# Patient Record
Sex: Male | Born: 1980 | Race: White | Hispanic: No | Marital: Married | Smoking: Former smoker
Health system: Southern US, Community
[De-identification: ages and names within clinical notes are randomized; demographics above are authoritative.]

## PROBLEM LIST (undated history)

## (undated) DIAGNOSIS — T8859XA Other complications of anesthesia, initial encounter: Secondary | ICD-10-CM

## (undated) DIAGNOSIS — R310 Gross hematuria: Secondary | ICD-10-CM

## (undated) DIAGNOSIS — N201 Calculus of ureter: Secondary | ICD-10-CM

## (undated) DIAGNOSIS — R3915 Urgency of urination: Secondary | ICD-10-CM

## (undated) DIAGNOSIS — Z87442 Personal history of urinary calculi: Secondary | ICD-10-CM

## (undated) DIAGNOSIS — N200 Calculus of kidney: Secondary | ICD-10-CM

## (undated) DIAGNOSIS — T4145XA Adverse effect of unspecified anesthetic, initial encounter: Secondary | ICD-10-CM

---

## 2001-07-11 ENCOUNTER — Encounter: Payer: Self-pay | Admitting: Emergency Medicine

## 2001-07-11 ENCOUNTER — Emergency Department (HOSPITAL_COMMUNITY): Admission: EM | Admit: 2001-07-11 | Discharge: 2001-07-11 | Payer: Self-pay | Admitting: Emergency Medicine

## 2002-08-07 ENCOUNTER — Encounter: Payer: Self-pay | Admitting: Emergency Medicine

## 2002-08-07 ENCOUNTER — Emergency Department (HOSPITAL_COMMUNITY): Admission: EM | Admit: 2002-08-07 | Discharge: 2002-08-07 | Payer: Self-pay | Admitting: Emergency Medicine

## 2003-02-26 ENCOUNTER — Emergency Department (HOSPITAL_COMMUNITY): Admission: EM | Admit: 2003-02-26 | Discharge: 2003-02-26 | Payer: Self-pay

## 2003-02-26 ENCOUNTER — Encounter: Payer: Self-pay | Admitting: Emergency Medicine

## 2003-07-09 ENCOUNTER — Emergency Department (HOSPITAL_COMMUNITY): Admission: AD | Admit: 2003-07-09 | Discharge: 2003-07-09 | Payer: Self-pay | Admitting: Family Medicine

## 2003-07-27 ENCOUNTER — Emergency Department (HOSPITAL_COMMUNITY): Admission: EM | Admit: 2003-07-27 | Discharge: 2003-07-28 | Payer: Self-pay | Admitting: Emergency Medicine

## 2003-10-19 ENCOUNTER — Emergency Department (HOSPITAL_COMMUNITY): Admission: EM | Admit: 2003-10-19 | Discharge: 2003-10-19 | Payer: Self-pay | Admitting: Family Medicine

## 2003-10-31 ENCOUNTER — Emergency Department (HOSPITAL_COMMUNITY): Admission: EM | Admit: 2003-10-31 | Discharge: 2003-10-31 | Payer: Self-pay

## 2003-12-05 ENCOUNTER — Encounter (INDEPENDENT_AMBULATORY_CARE_PROVIDER_SITE_OTHER): Payer: Self-pay | Admitting: *Deleted

## 2003-12-05 ENCOUNTER — Ambulatory Visit (HOSPITAL_COMMUNITY): Admission: RE | Admit: 2003-12-05 | Discharge: 2003-12-05 | Payer: Self-pay | Admitting: Gastroenterology

## 2004-05-05 HISTORY — PX: LUMBAR FUSION: SHX111

## 2004-07-17 ENCOUNTER — Ambulatory Visit: Payer: Self-pay

## 2004-10-28 ENCOUNTER — Emergency Department (HOSPITAL_COMMUNITY): Admission: EM | Admit: 2004-10-28 | Discharge: 2004-10-28 | Payer: Self-pay | Admitting: Emergency Medicine

## 2005-06-28 ENCOUNTER — Emergency Department (HOSPITAL_COMMUNITY): Admission: EM | Admit: 2005-06-28 | Discharge: 2005-06-28 | Payer: Self-pay | Admitting: Family Medicine

## 2006-02-05 ENCOUNTER — Emergency Department (HOSPITAL_COMMUNITY): Admission: EM | Admit: 2006-02-05 | Discharge: 2006-02-05 | Payer: Self-pay | Admitting: Emergency Medicine

## 2006-02-09 ENCOUNTER — Emergency Department (HOSPITAL_COMMUNITY): Admission: EM | Admit: 2006-02-09 | Discharge: 2006-02-09 | Payer: Self-pay | Admitting: Emergency Medicine

## 2006-02-11 ENCOUNTER — Emergency Department (HOSPITAL_COMMUNITY): Admission: EM | Admit: 2006-02-11 | Discharge: 2006-02-11 | Payer: Self-pay | Admitting: Emergency Medicine

## 2006-02-16 ENCOUNTER — Ambulatory Visit (HOSPITAL_BASED_OUTPATIENT_CLINIC_OR_DEPARTMENT_OTHER): Admission: RE | Admit: 2006-02-16 | Discharge: 2006-02-16 | Payer: Self-pay | Admitting: Urology

## 2006-02-16 HISTORY — PX: OTHER SURGICAL HISTORY: SHX169

## 2006-10-30 ENCOUNTER — Emergency Department (HOSPITAL_COMMUNITY): Admission: EM | Admit: 2006-10-30 | Discharge: 2006-10-30 | Payer: Self-pay | Admitting: Emergency Medicine

## 2007-02-13 ENCOUNTER — Emergency Department (HOSPITAL_COMMUNITY): Admission: EM | Admit: 2007-02-13 | Discharge: 2007-02-13 | Payer: Self-pay | Admitting: Emergency Medicine

## 2007-04-12 ENCOUNTER — Emergency Department (HOSPITAL_COMMUNITY): Admission: EM | Admit: 2007-04-12 | Discharge: 2007-04-12 | Payer: Self-pay | Admitting: Family Medicine

## 2007-06-07 ENCOUNTER — Emergency Department (HOSPITAL_COMMUNITY): Admission: EM | Admit: 2007-06-07 | Discharge: 2007-06-07 | Payer: Self-pay | Admitting: Emergency Medicine

## 2007-12-01 ENCOUNTER — Emergency Department (HOSPITAL_COMMUNITY): Admission: EM | Admit: 2007-12-01 | Discharge: 2007-12-01 | Payer: Self-pay | Admitting: Emergency Medicine

## 2008-03-22 ENCOUNTER — Emergency Department (HOSPITAL_COMMUNITY): Admission: EM | Admit: 2008-03-22 | Discharge: 2008-03-22 | Payer: Self-pay | Admitting: Emergency Medicine

## 2008-03-23 ENCOUNTER — Ambulatory Visit: Payer: Self-pay | Admitting: Family Medicine

## 2008-03-23 DIAGNOSIS — M545 Low back pain: Secondary | ICD-10-CM

## 2008-03-23 DIAGNOSIS — F17200 Nicotine dependence, unspecified, uncomplicated: Secondary | ICD-10-CM | POA: Insufficient documentation

## 2008-03-28 ENCOUNTER — Telehealth: Payer: Self-pay | Admitting: Family Medicine

## 2008-03-28 ENCOUNTER — Ambulatory Visit: Payer: Self-pay | Admitting: Family Medicine

## 2008-03-31 ENCOUNTER — Telehealth (INDEPENDENT_AMBULATORY_CARE_PROVIDER_SITE_OTHER): Payer: Self-pay | Admitting: Family Medicine

## 2008-08-03 ENCOUNTER — Emergency Department (HOSPITAL_COMMUNITY): Admission: EM | Admit: 2008-08-03 | Discharge: 2008-08-03 | Payer: Self-pay | Admitting: Emergency Medicine

## 2008-08-29 ENCOUNTER — Ambulatory Visit (HOSPITAL_COMMUNITY): Admission: RE | Admit: 2008-08-29 | Discharge: 2008-08-29 | Payer: Self-pay | Admitting: Family Medicine

## 2008-08-29 ENCOUNTER — Emergency Department (HOSPITAL_COMMUNITY): Admission: EM | Admit: 2008-08-29 | Discharge: 2008-08-29 | Payer: Self-pay | Admitting: Emergency Medicine

## 2008-08-29 ENCOUNTER — Ambulatory Visit: Payer: Self-pay | Admitting: Family Medicine

## 2008-08-29 DIAGNOSIS — R079 Chest pain, unspecified: Secondary | ICD-10-CM

## 2008-09-02 ENCOUNTER — Emergency Department (HOSPITAL_COMMUNITY): Admission: EM | Admit: 2008-09-02 | Discharge: 2008-09-02 | Payer: Self-pay | Admitting: Emergency Medicine

## 2008-09-04 ENCOUNTER — Encounter: Payer: Self-pay | Admitting: Family Medicine

## 2008-09-05 ENCOUNTER — Ambulatory Visit: Payer: Self-pay | Admitting: Family Medicine

## 2008-09-05 DIAGNOSIS — N200 Calculus of kidney: Secondary | ICD-10-CM | POA: Insufficient documentation

## 2008-09-06 ENCOUNTER — Encounter: Payer: Self-pay | Admitting: Family Medicine

## 2008-09-18 ENCOUNTER — Telehealth (INDEPENDENT_AMBULATORY_CARE_PROVIDER_SITE_OTHER): Payer: Self-pay | Admitting: *Deleted

## 2008-09-25 ENCOUNTER — Telehealth: Payer: Self-pay | Admitting: Family Medicine

## 2009-12-10 ENCOUNTER — Emergency Department: Payer: Self-pay | Admitting: Emergency Medicine

## 2010-06-24 ENCOUNTER — Encounter: Payer: Self-pay | Admitting: *Deleted

## 2010-08-13 LAB — URINALYSIS, ROUTINE W REFLEX MICROSCOPIC
Bilirubin Urine: NEGATIVE
Ketones, ur: NEGATIVE mg/dL
Leukocytes, UA: NEGATIVE
Protein, ur: 100 mg/dL — AB
Specific Gravity, Urine: 1.019 (ref 1.005–1.030)
pH: 6.5 (ref 5.0–8.0)

## 2010-08-13 LAB — POCT I-STAT, CHEM 8
Creatinine, Ser: 1.3 mg/dL (ref 0.4–1.5)
HCT: 45 % (ref 39.0–52.0)
Hemoglobin: 15.3 g/dL (ref 13.0–17.0)
Sodium: 143 mEq/L (ref 135–145)
TCO2: 26 mmol/L (ref 0–100)

## 2010-08-13 LAB — URINE MICROSCOPIC-ADD ON

## 2010-08-14 LAB — POCT I-STAT, CHEM 8
BUN: 7 mg/dL (ref 6–23)
Calcium, Ion: 1.12 mmol/L (ref 1.12–1.32)
Creatinine, Ser: 1 mg/dL (ref 0.4–1.5)
HCT: 44 % (ref 39.0–52.0)
Hemoglobin: 15 g/dL (ref 13.0–17.0)
Sodium: 141 mEq/L (ref 135–145)
TCO2: 24 mmol/L (ref 0–100)

## 2010-08-14 LAB — D-DIMER, QUANTITATIVE: D-Dimer, Quant: <0.22 ug/mL-FEU (ref 0.00–0.48)

## 2010-09-20 NOTE — Op Note (Signed)
NAMETRUXTON, STUPKA               ACCOUNT NO.:  [id number]   MEDICAL RECORD NO.:  [id number]          PATIENT TYPE:  AMB   LOCATION:  [facility]                         FACILITY:  [facility]   PHYSICIAN:  Maretta Bees. Vonita Moss, M.D.DATE OF BIRTH:  [birth year]   DATE OF PROCEDURE:  02/16/2006  DATE OF DISCHARGE:                                 OPERATIVE REPORT   PREOPERATIVE DIAGNOSIS:  Distal right ureteral calculus,   POSTOPERATIVE DIAGNOSIS:  Distal right ureteral calculus, plus mild urethral  meatal stenosis.   PROCEDURE:  Cystoscopy and urethral meatal dilation, right ureteroscopy and  stone basketing.   SURGEON:  Dr. Larey Dresser   ANESTHESIA:  General.   INDICATIONS:  This 30 year old gentleman has been recurrent symptomatic and  missing work from the distal right ureteral stone confirmed to be still  present on a CT scan this morning.  He and his wife opted for removal today.   PROCEDURE:  The patient brought to the operating room, placed lithotomy  position.  External genitalia were prepped and draped in usual fashion.  He  was cystoscoped but only after I had dilated a mild urethral meatal  stenosis.  Anterior and prostatic urethra were normal.  The bladder was  unremarkable.  I tried to pass the sensor wire up the right ureter but it  would not go very easily so I inserted a Glidewire which did go up easily  past the stone.  With the Glidewire in position in the right ureter,  I used  the inner core of the ureteral access dilating sheath and dilated the  intramural ureter.  I then utilized 6-French rigid ureteroscope and  visualized the stone about 2 cm up the ureter and used the nitinol stone  basket to retrieve it intact and then I gave the stone to the patient's  wife.  There was no significant ureteral trauma, so I removed the Glidewire  and did not leave in a double-J catheter.  I emptied the bladder and he was  sent to recovery room in good condition.  He was given 30 mg of  Toradol for  postop pain relief.      Maretta Bees. Vonita Moss, M.D.  Electronically Signed     LJP/MEDQ  D:  02/16/2006  T:  02/17/2006  Job:  [id number]

## 2010-09-20 NOTE — Op Note (Signed)
[name], Kent Goodwin                           ACCOUNT NO.:  [id number]   MEDICAL RECORD NO.:  [id number]                   PATIENT TYPE:  AMB   LOCATION:  ENDO                                 FACILITY:  [facility]   PHYSICIAN:  John C. Madilyn Fireman, M.D.                 DATE OF BIRTH:  06/11/1980   DATE OF PROCEDURE:  12/05/2003  DATE OF DISCHARGE:                                 OPERATIVE REPORT   PROCEDURE:  Esophagogastroduodenoscopy with biopsy.   INDICATIONS FOR PROCEDURE:  Abdominal pain, nausea, vomiting, diarrhea and  weight loss.   DESCRIPTION OF PROCEDURE:  The patient was placed in the left lateral  decubitus position then placed on the pulse monitor with continuous low flow  oxygen delivered by nasal cannula. He was sedated with 50 mcg IV fentanyl  and 6 mg IV Versed. The Olympus video endoscope was advanced under direct  vision into the oropharynx and esophagus. The esophagus was straight and of  normal caliber with the squamocolumnar line at 38 cm. There was no visible  hiatal hernia, ring, stricture or other abnormality of the GE junction. The  stomach was entered and a small amount of liquid secretions were suctioned  from the fundus. Retroflexed view of the cardia was unremarkable. The fundus  and body appeared normal. The antrum showed some increased erythema and  granularity consistent with mild antral gastritis, no focal erosions, ulcers  or exudate.  A CLOtest was obtained.  The pylorus was nondeformed and easily  allowed passage of the endoscope tip into the duodenum. Both the bulb and  second portion were well inspected and appear to be within normal limits.  The Papilla or Vater was seen and was also normal. Small bowel biopsies were  taken to rule out celiac disease. The scope was then withdrawn and the  patient prepared for colonoscopy.  He tolerated the procedure well and there  were no immediate complications.   IMPRESSION:  Mild antral gastritis otherwise normal  study.   PLAN:  Will await biopsy and CLOtest and will proceed with colonoscopy.                                               John C. Madilyn Fireman, M.D.    JCH/MEDQ  D:  12/05/2003  T:  12/05/2003  Job:  [id number]

## 2010-09-20 NOTE — Op Note (Signed)
Kent Goodwin, Kent Goodwin                           ACCOUNT NO.:  [id number]   MEDICAL RECORD NO.:  [id number]                   PATIENT TYPE:  AMB   LOCATION:  ENDO                                 [facility]ACILITY:  [facility]   PHYSICIAN:  John C. Madilyn Fireman, M.D.                 DATE OF BIRTH:  1981-01-12   DATE OF PROCEDURE:  12/05/2003  DATE OF DISCHARGE:                                 OPERATIVE REPORT   PROCEDURE:  Colonoscopy.   INDICATIONS FOR PROCEDURE:  Abdominal pain, nausea, vomiting, diarrhea and  weight loss.   DESCRIPTION OF PROCEDURE:  The patient was placed in the left lateral  decubitus position then placed on the pulse monitor with continuous low flow  oxygen delivered by nasal cannula. He was sedated with 50 mcg IV fentanyl  and 4 mg IV Versed in addition to the medicine given for the previous EGD.  The Olympus video colonoscope was inserted into the rectum and advanced to  the cecum, confirmed by transillumination at McBurney's point and  visualization of the ileocecal valve and appendiceal orifice. The prep was  excellent. The terminal ileum was intubated and explored for several  centimeters and appeared to be within normal limits.  The cecum, ascending,  transverse, descending, sigmoid and rectum all appeared normal with no  masses, polyps, diverticula or other mucosal abnormalities. Biopsies were  taken of the rectum and sigmoid to rule out microscopic colitis. The scope  was then withdrawn and the patient returned to the recovery room in stable  condition. He tolerated the procedure well and there were no immediate  complications.   IMPRESSION:  Normal colonoscopy including the terminal ileum.   PLAN:  Will await all biopsies.                                               John C. Madilyn Fireman, M.D.    JCH/MEDQ  D:  12/05/2003  T:  12/05/2003  Job:  [id number]

## 2011-01-09 ENCOUNTER — Emergency Department (HOSPITAL_COMMUNITY)
Admission: EM | Admit: 2011-01-09 | Discharge: 2011-01-10 | Disposition: A | Discharge status: Home / Self Care | Payer: 59 | Attending: Emergency Medicine | Admitting: Emergency Medicine

## 2011-01-09 DIAGNOSIS — N201 Calculus of ureter: Secondary | ICD-10-CM | POA: Insufficient documentation

## 2011-01-09 DIAGNOSIS — R109 Unspecified abdominal pain: Secondary | ICD-10-CM | POA: Insufficient documentation

## 2011-01-09 DIAGNOSIS — Z87442 Personal history of urinary calculi: Secondary | ICD-10-CM | POA: Insufficient documentation

## 2011-01-09 DIAGNOSIS — R319 Hematuria, unspecified: Secondary | ICD-10-CM | POA: Insufficient documentation

## 2011-01-10 ENCOUNTER — Encounter (HOSPITAL_COMMUNITY): Payer: Self-pay

## 2011-01-10 ENCOUNTER — Emergency Department (HOSPITAL_COMMUNITY): Payer: 59

## 2011-01-12 ENCOUNTER — Emergency Department (HOSPITAL_COMMUNITY)
Admission: EM | Admit: 2011-01-12 | Discharge: 2011-01-12 | Disposition: A | Discharge status: Home / Self Care | Payer: 59 | Attending: Emergency Medicine | Admitting: Emergency Medicine

## 2011-01-12 DIAGNOSIS — R109 Unspecified abdominal pain: Secondary | ICD-10-CM | POA: Insufficient documentation

## 2011-01-12 DIAGNOSIS — N200 Calculus of kidney: Secondary | ICD-10-CM | POA: Insufficient documentation

## 2011-01-12 LAB — URINALYSIS, ROUTINE W REFLEX MICROSCOPIC
Bilirubin Urine: NEGATIVE
Glucose, UA: NEGATIVE mg/dL
pH: 6 (ref 5.0–8.0)

## 2011-01-12 LAB — DIFFERENTIAL
Basophils Relative: 0 % (ref 0–1)
Eosinophils Absolute: 0.2 10*3/uL (ref 0.0–0.7)
Monocytes Relative: 10 % (ref 3–12)
Neutro Abs: 8.6 10*3/uL — ABNORMAL HIGH (ref 1.7–7.7)

## 2011-01-12 LAB — CBC
Hemoglobin: 15.4 g/dL (ref 13.0–17.0)
MCHC: 34.9 g/dL (ref 30.0–36.0)
Platelets: 224 10*3/uL (ref 150–400)
RBC: 5.11 MIL/uL (ref 4.22–5.81)

## 2011-01-12 LAB — BASIC METABOLIC PANEL
BUN: 12 mg/dL (ref 6–23)
CO2: 28 mEq/L (ref 19–32)
Chloride: 102 mEq/L (ref 96–112)
Creatinine, Ser: 1.91 mg/dL — ABNORMAL HIGH (ref 0.50–1.35)
Glucose, Bld: 108 mg/dL — ABNORMAL HIGH (ref 70–99)
Sodium: 138 mEq/L (ref 135–145)

## 2011-01-13 ENCOUNTER — Inpatient Hospital Stay (HOSPITAL_COMMUNITY)
Admission: AD | Admit: 2011-01-13 | Discharge: 2011-01-14 | DRG: 694 | Disposition: A | Discharge status: Home / Self Care | Payer: 59 | Source: Ambulatory Visit | Attending: Urology | Admitting: Urology

## 2011-01-13 DIAGNOSIS — N1330 Unspecified hydronephrosis: Secondary | ICD-10-CM | POA: Diagnosis present

## 2011-01-13 DIAGNOSIS — N200 Calculus of kidney: Secondary | ICD-10-CM | POA: Diagnosis present

## 2011-01-13 DIAGNOSIS — N201 Calculus of ureter: Principal | ICD-10-CM | POA: Diagnosis present

## 2011-01-13 DIAGNOSIS — G8929 Other chronic pain: Secondary | ICD-10-CM | POA: Diagnosis present

## 2011-01-13 DIAGNOSIS — M549 Dorsalgia, unspecified: Secondary | ICD-10-CM | POA: Diagnosis present

## 2011-01-13 DIAGNOSIS — R112 Nausea with vomiting, unspecified: Secondary | ICD-10-CM | POA: Diagnosis present

## 2011-01-14 HISTORY — PX: OTHER SURGICAL HISTORY: SHX169

## 2011-01-14 LAB — URINE MICROSCOPIC-ADD ON

## 2011-01-14 LAB — CBC
HCT: 39.4 % (ref 39.0–52.0)
Hemoglobin: 13.2 g/dL (ref 13.0–17.0)
MCH: 29.5 pg (ref 26.0–34.0)
MCHC: 33.5 g/dL (ref 30.0–36.0)
MCV: 87.9 fL (ref 78.0–100.0)
Platelets: 202 10*3/uL (ref 150–400)
RBC: 4.48 MIL/uL (ref 4.22–5.81)
RDW: 12.5 % (ref 11.5–15.5)
WBC: 8 10*3/uL (ref 4.0–10.5)

## 2011-01-14 LAB — URINALYSIS, ROUTINE W REFLEX MICROSCOPIC
Bilirubin Urine: NEGATIVE
Glucose, UA: NEGATIVE mg/dL
Nitrite: NEGATIVE
Specific Gravity, Urine: 1.012 (ref 1.005–1.030)
Urobilinogen, UA: 1 mg/dL (ref 0.0–1.0)
pH: 6.5 (ref 5.0–8.0)

## 2011-01-14 LAB — BASIC METABOLIC PANEL
BUN: 14 mg/dL (ref 6–23)
Chloride: 105 mEq/L (ref 96–112)
GFR calc Af Amer: 47 mL/min — ABNORMAL LOW (ref >60)
GFR calc non Af Amer: 39 mL/min — ABNORMAL LOW (ref >60)
Glucose, Bld: 101 mg/dL — ABNORMAL HIGH (ref 70–99)
Potassium: 4.9 mEq/L (ref 3.5–5.1)
Sodium: 141 mEq/L (ref 135–145)

## 2011-01-14 LAB — URINE CULTURE

## 2011-01-21 ENCOUNTER — Ambulatory Visit (HOSPITAL_BASED_OUTPATIENT_CLINIC_OR_DEPARTMENT_OTHER)
Admission: RE | Admit: 2011-01-21 | Discharge: 2011-01-21 | Disposition: A | Discharge status: Home / Self Care | Payer: 59 | Source: Ambulatory Visit | Attending: Urology | Admitting: Urology

## 2011-01-21 DIAGNOSIS — Z79899 Other long term (current) drug therapy: Secondary | ICD-10-CM | POA: Insufficient documentation

## 2011-01-21 DIAGNOSIS — F17200 Nicotine dependence, unspecified, uncomplicated: Secondary | ICD-10-CM | POA: Insufficient documentation

## 2011-01-21 DIAGNOSIS — N200 Calculus of kidney: Secondary | ICD-10-CM | POA: Insufficient documentation

## 2011-01-21 DIAGNOSIS — N201 Calculus of ureter: Secondary | ICD-10-CM | POA: Insufficient documentation

## 2011-01-21 LAB — BASIC METABOLIC PANEL
BUN: 17 mg/dL (ref 6–23)
CO2: 26 mEq/L (ref 19–32)
GFR calc non Af Amer: >60 mL/min (ref >60)
Glucose, Bld: 101 mg/dL — ABNORMAL HIGH (ref 70–99)
Potassium: 4.1 mEq/L (ref 3.5–5.1)
Sodium: 135 mEq/L (ref 135–145)

## 2011-01-21 LAB — CBC
HCT: 44.9 % (ref 39.0–52.0)
Hemoglobin: 15.4 g/dL (ref 13.0–17.0)
MCH: 29.8 pg (ref 26.0–34.0)
MCHC: 34.3 g/dL (ref 30.0–36.0)
RBC: 5.17 MIL/uL (ref 4.22–5.81)

## 2011-01-24 LAB — URINALYSIS, ROUTINE W REFLEX MICROSCOPIC
Glucose, UA: NEGATIVE
Specific Gravity, Urine: 1.03
pH: 6

## 2011-01-24 LAB — POCT I-STAT CREATININE
Creatinine, Ser: 1.1
Operator id: 151321

## 2011-01-24 LAB — I-STAT 8, (EC8 V) (CONVERTED LAB)
Acid-base deficit: 1
Bicarbonate: 21.9
Chloride: 109
HCT: 47
Operator id: 151321
TCO2: 23
pCO2, Ven: 29.9 — ABNORMAL LOW
pH, Ven: 7.473 — ABNORMAL HIGH

## 2011-01-24 LAB — URINE MICROSCOPIC-ADD ON

## 2011-01-27 NOTE — Op Note (Addendum)
NAMESHERRI, LEVENHAGEN               ACCOUNT NO.:  [id number]  MEDICAL RECORD NO.:  [id number]  LOCATION:  1421                         FACILITY:  [facility]  PHYSICIAN:  Jerilee Field, MD   DATE OF BIRTH:  1980-08-24  DATE OF PROCEDURE:  01/21/2011 DATE OF DISCHARGE:  01/21/2011                              OPERATIVE REPORT   PREOPERATIVE DIAGNOSES: 1. Left ureteral stone. 2. Left renal stone.  POSTOPERATIVE DIAGNOSES: 1. Left ureteral stone. 2. Left renal stone.  PROCEDURES:  Cystoscopy with left ureteroscopy, laser lithotripsy, stone basket extraction, and stent placement.  SURGEON:  Jerilee Field, MD  TYPE OF ANESTHESIA:  General.  INDICATIONS FOR PROCEDURE:  Mr. Keadle is 30 years old.  He had a 4-mm left proximal stone with hydro in the left proximal ureter.  He underwent cystoscopy with stent placement, but I was not able to get retrograde access with the ureteroscope at that time.  He followed up in the office with his stent, having typical stent colic.  We repeated the KUB which showed the stone in the left proximal ureter beside the stent and several other calcifications in the upper, middle, and lower pole, which were also visible on the CT.  We discussed the nature, risks, benefits, and alternatives to ureteroscopy; a laser lithotripsy with stone extraction versus extracorporeal shock wave lithotripsy versus no treatment.  All questions were answered and the patient elected to proceed with ureteroscopic extraction.  We discussed that with a lot of stone burden.  He may require multiple procedures and all of the fragments may fail to pass.  We also discussed risks of bleeding, infection, and ureteral injury among others.  All questions were answered and again he elected to proceed with ureteroscopy.  FINDINGS:  On fluoroscopy, the stone was still visible on the proximal ureter, but was pushed into the kidney gaining retrograde access.  There were also upper,  middle, and lower pole calcifications.  All calcifications were sought out on fluoro and ureteroscopically, and fragmented into small 1 to 2-mm pieces.  At the end of fragmentation, no stones were visible on fluoroscopy.  Several fragments were removed for analysis, but these fragments were all 1 or 2 mm and very difficult to get into a basket.  DESCRIPTION OF PROCEDURE:  After consent was obtained, the patient brought to the operating room.  A time-out was performed explaining the patient and procedure.  He was given preop antibiotics and SCDs were in place.  After adequate anesthesia, he was placed in lithotomy and the external genitalia prepped and draped in the usual fashion.  A rigid cystoscope was passed per urethra where the left stent was visualized, a scout fluoro image was obtained as previously dictated.  The stent was grasped and pulled through the urethral meatus and a Sensor wire was advanced through the stent and coiled in the upper pole.  Semirigid ureteroscope was used to carefully inspect the distal ureter to ensure there were no stones here, and indeed the distal ureter was stone and injury free.  The semirigid scope was removed and the ureteral access sheath was placed without difficulty.  The flexible ureteroscope was then advanced up into the kidney.  Prior to removing the semirigid scope, a second wire was advanced and coiled in the kidney.  When the second wire went up, stone in the proximal ureter moved up into the kidney.  The ureteral access sheath was placed over the second wire and again the second wire was removed and the flexible ureteroscope was advanced up into the kidney.  Because the stone washed into the kidney, fluoroscopy was used to locate all the stones.  There was a collection of 2 stones in the lower, middle, and upper poles.  There was a larger stone in the lower pole and a larger one in the upper pole.  The entire kidney was first inspected,  then a 265 micron laser fiber was advanced to the setting of 0.5 and 12.  All stones were fragmented to small 1 to 2-mm pieces.  At the end of fragmentation, fluoroscopy revealed no visible stones.  I attempted to grasp as many pieces as I could from these fragments, but they were very small and were difficult even in getting the nitinol basket.  Again, all stone areas were visualized ureteroscopically and noted to have excellent fragmentation as well as on fluoroscopy, no stones were visualized.  Therefore, visualization was becoming difficult in the kidney due to some light hematuria.  The ureteral access sheath was backed out onto the ureteroscope and the renal pelvis, UPJ, entire ureter were carefully inspected and noted to be stone and injury free.  Many small fragments were left in the kidney, but again were not visible on fluoro.  The remaining wire was back loaded on the cystoscope and a 6 x 26 stent was placed.  The wire was removed.  A good coil was seen in the kidney and a good coil in the bladder.  The patient was then awakened, taken to recovery room in stable condition.  A string was left on the stent.  SPECIMENS:  Stone fragments to Pathology.  ESTIMATED BLOOD LOSS:  Less than 5 mL.  COMPLICATIONS:  None.  DISPOSITION:  The patient stable to PACU.          ______________________________ Jerilee Field, MD     ME/MEDQ  D:  01/21/2011  T:  01/21/2011  Job:  [id number]  Electronically Signed by Jerilee Field MD on 02/19/2011 12:59:35 PM

## 2011-01-27 NOTE — Op Note (Signed)
Kent Goodwin, Kent Goodwin               ACCOUNT NO.:  [id number]  MEDICAL RECORD NO.:  [id number]  LOCATION:  1421                         FACILITY:  [facility]  PHYSICIAN:  Jerilee Field, MD   DATE OF BIRTH:  12/05/1980  DATE OF PROCEDURE: DATE OF DISCHARGE:  01/14/2011                              OPERATIVE REPORT   PREOPERATIVE DIAGNOSES: 1. Left ureteral stone. 2. Left renal stone.  POSTOPERATIVE DIAGNOSES: 1. Left ureteral stone. 2. Left renal stone.  PROCEDURE:  Cystoscopy with left retrograde pyelogram and left ureteral stent placement.  SURGEON:  Jerilee Field, MD.  TYPE OF ANESTHESIA:  General.  INDICATIONS FOR PROCEDURE:  Kent Goodwin is a 30 year old white male who has a known history of stone formation.  He had a sudden onset of left flank pain approximately 4 days ago.  He was seen in the emergency room twice and then our office and subsequently continued to have uncontrolled pain, nausea, and vomiting.  We discussed the nature of cystoscopy with left ureteral stent placement, possible ureteroscopy, and laser lithotripsy with stone removal.  We discussed the nature, risks, benefits, and alternatives to this procedure including nontreatment or extracorporeal shock wave lithotripsy.  We did discuss risk of infection, bleeding, and injury to the ureter and the need for __________ procedures among others.  All questions were answered and the patient elected to proceed with cystoscopy, stent placement, possible ureteroscopy, and laser lithotripsy.  FINDINGS:  On scout imaging in the left kidney, ureter, and bladder area; there were several calcifications noted in the kidney.  There was a small calcification in the region of the left proximal ureter/UPJ consistent with the patient's KUB yesterday in the office indicating the stone is not progressed.  On retrograde injection of contrast, it is outlined that normal caliber ureter all the way out to where contrast went  around the stone at the left ureteropelvic junction indicated also by a small filling defect and some hesitation of contrast in this area until contrast went up in the kidney.  The upper, middle, and lower calyces and renal pelvis appeared normal.  Despite attempted dilation with a dual-lumen exchange catheter and the inner cannula of the ureteral access sheath, the ureter was simply would not dilate.  Given the ureter was of normal caliber all the way up to the stone, I decided to leave the stent and then consider shock wave or further ureteroscopy in the coming weeks.  DESCRIPTION OF PROCEDURE:  After consent was obtained, the patient was brought to the operating room.  Time-out was performed to confirm the patient and procedure.  After adequate anesthesia, he was placed in lithotomy position, preoperative antibiotics and SCDs were given.  He was prepped and draped in the usual fashion.  A rigid cystoscope was passed per urethra into the bladder.  5-French open-ended catheter was entered to obtain a left retrograde injection of contrast with findings as previously dictated.  A sensor wire was then advanced.  The sensor wire went up to the stone, where it was held up in the left proximal ureter.  Therefore, the open-ended catheter was advanced over the sensor wire, which gave it enough backing to slide the wire into  the kidney where it coiled in the upper pole.  The ureteral catheter was removed. The bladder was drained and the scope removed.  A dual-lumen exchange catheter was advanced, but this would not traverse the intramural ureter.  I did try to pass a second sensor wire or a Glidewire through the dual-lumen.  The ureter was simply too tight around the dual-lumen to even allow another wire up the ureter.  Therefore, this was removed. The inner wound cannula of a ureteral access sheath was advanced over the sensor wire.  This again engaged the distal intramural ureter on the left,  but would not advanced into the ureter.  I removed this and placed the cystoscope sheath through the urethra and lined it up with the wire to the brace the inner cannula and tried to dilate the ureter again and it simply would not dilate without significant force.  Therefore, I decided to leave the ureteral stent.  The wire was back loaded on a cystoscope and a 6 x 26 cm stent was placed.  The wire was removed.  A good coil was seen in the left kidney and a good coil in the bladder. There was copious amount of dark proteinaceous material draining from the stent holes after the stent was in place indicating relief of some upper tract obstruction.  The bladder was drained.  The scope was then removed.  ESTIMATED BLOOD LOSS:  Less than 5 mL.  SPECIMENS:  None.  COMPLICATIONS:  None.  DISPOSITION:  The patient is stable to PACU.  He will be discharged to home and follow up in 1 week to plan further stone management.  I discussed with the patient and his wife preoperatively the importance of follow up of the stent to prevent permanent renal damage.          ______________________________ Jerilee Field, MD     ME/MEDQ  D:  01/14/2011  T:  01/14/2011  Job:  [id number]  Electronically Signed by Jerilee Field MD on 01/27/2011 12:34:07 PM

## 2011-01-27 NOTE — H&P (Signed)
NAMEANTONY, Goodwin NO.:  [id number]  MEDICAL RECORD NO.:  [id number]  LOCATION:  1421                         FACILITY:  [facility]  PHYSICIAN:  Jerilee Field, MD   DATE OF BIRTH:  1981/04/09  DATE OF ADMISSION:  01/13/2011 DATE OF DISCHARGE:                             HISTORY & PHYSICAL   HISTORY OF PRESENT ILLNESS:  [name] is a 30 year old white male.  He developed the sudden onset of left flank pain approximately 3 days ago. He was seen in the emergency room on January 10, 2011.  CT scan revealed a 4 mm left proximal ureteral stone.  The patient does have prior history of stones requiring ureteroscopy and basket extraction. The patient was discharged to home, but he returned to the ER yesterday on September 9, due to uncontrolled pain and nausea and vomiting.  The patient returned to the clinic today, was seen in the outpatient office again, had uncontrolled pain and nausea and vomiting today.  KUB was done and showed no progression and was still in the left proximal ureter on KUB.  The patient denies dysuria, fevers, and chills.  REVIEW OF SYSTEMS:  Ten-system review of system was obtained and is negative except as noted in the HPI.  PAST MEDICAL HISTORY: 1. Chronic back pain. 2. Nephrolithiasis.  SURGICAL HISTORY:  The patient has had back surgery with a back fusion.  FAMILY HISTORY:  Significant for diabetes and hypertension.  SOCIAL HISTORY:  The patient denies smoking and drug use.  He does occasionally drink alcohol.  ALLERGIES:  He has no known drug allergies.  MEDICATIONS:  Oxycodone and acetaminophen.  Also Zofran, tamsulosin, and ibuprofen.  PHYSICAL EXAMINATION:  VITAL SIGNS:  Patient's temperature is 99.2, pulse of 86, respiration 18, and blood pressure 151/93. GENERAL:  He is in no acute distress, although he looked slightly uncomfortable. HEENT:  His eyes have a normal appearance without scleral icterus.  ENT shows a normal  mouth and pharynx. CARDIOVASCULAR:  Reveals a regular rate and rhythm. RESPIRATORY:  Has a normal rate, depth, and effort. ABDOMEN:  Soft and nontender with some mild left CVA tenderness. EXTREMITIES:  Showed no edema. NEUROLOGIC:  Shows alert and oriented with no focal deficits. SKIN:  Normal color with no rashes.  OBJECTIVE DATA:  He did have a white count of 12.  A urinalysis showed negative leukocyte esterase and negative nitrite.  He had 0 to 2 red blood cells per high-powered field.  His BUN was 12, creatinine 1.91. CT scan was obtained which again showed a 4 mm stone in the left proximal ureter with numerous bilateral intrarenal calculi, although no other right ureteral stone.  He did have mild left hydronephrosis.  A KUB in our office was done which showed a calcification in the area of the left proximal ureter as well as several calcifications in the left kidney.  IMPRESSION: 1. Left ureteral stone. 2. Nephrolithiasis. 3. Left hydronephrosis. 4. Nausea and vomiting.  PLAN:  The patient clearly is failed medical expulsion therapy and stone passage.  I discussed the CT and KUB findings with the patient.  We discussed the nature, risk, and benefits of continued stone passage with  medical expulsion therapy, elective extracorporeal shock-wave lithotripsy, and cystoscopy, left ureteral stent placement, possible left ureteroscopy with laser lithotripsy.  All questions were answered. I did discuss the risk of ureteroscopy included ureteral injury, bleeding, infection, among others.  I discussed stones in the proximal ureter are difficult to access and will likely require a staged procedure with a stent placement and followed by either shockwave or ureteroscopy.  All questions were answered and the patient elected to proceed with cystoscopy, left ureteral stent, possible ureteroscopy, laser lithotripsy if retrograde access can be obtained.  He will be admitted for IV fluids,  antibiotics, and pain medicine.          ______________________________ Jerilee Field, MD     ME/MEDQ  D:  01/13/2011  T:  01/14/2011  Job:  [id number]  Electronically Signed by Jerilee Field MD on 01/27/2011 12:33:50 PM

## 2011-02-04 LAB — POCT RAPID STREP A: Streptococcus, Group A Screen (Direct): NEGATIVE

## 2011-02-10 LAB — POCT URINALYSIS DIP (DEVICE)
Operator id: 116391
Protein, ur: >=300 — AB
Specific Gravity, Urine: >=1.03
Specific Gravity, Urine: >=1.03
pH: 6.5

## 2011-02-19 NOTE — Discharge Summary (Signed)
  Kent Goodwin, NICHELSON NO.:  [id number]  MEDICAL RECORD NO.:  [id number]  LOCATION:  1421                         FACILITY:  [facility]  PHYSICIAN:  Jerilee Field, MD   DATE OF BIRTH:  May 02, 1981  DATE OF ADMISSION:  01/13/2011 DATE OF DISCHARGE:  01/14/2011                              DISCHARGE SUMMARY   HOSPITAL COURSE:  Mr. Applegate is a 30 year old male admitted with uncontrolled left flank pain and nausea and vomiting.  He had a 5 mm left proximal ureteral stone.  He underwent cystoscopy with left ureteral stent placement.  He remained stable and was discharged home on hospital day #1.  LAB DATA:  Sodium was 141, potassium 4.9, BUN 14, creatinine 2.0 up slightly from prior creatinine 1.9.  The patient did have good urine output during hospitalization.  DISCHARGE DIAGNOSES: 1. Left ureteral stone. 2. Left renal stones.  FINDINGS ON CT:  5 mm left proximal ureteral stone and multiple left renal stones.  PROCEDURES PERFORMED:  Cystoscopy with left ureteral stent placement.  CONDITION ON DISCHARGE:  The patient discharged to home.  DISCHARGE INSTRUCTIONS:  The patient was instructed to stop taking nonsteroidal anti-inflammatories and continue on Tylenol-based pain medication.  He was instructed to drink plenty of fluids and to return if he had any fevers, chills, nausea, vomiting, or other concerns.  DISCHARGE ACTIVITY:  Can be resumed as normal with a regular diet.  The patient is to follow up with Dr. Mena Goes in 1-2 weeks with a repeat KUB to plan stone treatment.          ______________________________ Jerilee Field, MD     ME/MEDQ  D:  01/27/2011  T:  01/27/2011  Job:  [id number]  Electronically Signed by Jerilee Field MD on 02/19/2011 12:59:27 PM

## 2012-02-28 ENCOUNTER — Encounter (HOSPITAL_COMMUNITY): Payer: Self-pay | Admitting: Emergency Medicine

## 2012-02-28 ENCOUNTER — Emergency Department (HOSPITAL_COMMUNITY): Payer: Self-pay

## 2012-02-28 ENCOUNTER — Emergency Department (HOSPITAL_COMMUNITY)
Admission: EM | Admit: 2012-02-28 | Discharge: 2012-02-28 | Disposition: A | Discharge status: Home / Self Care | Payer: Self-pay | Attending: Emergency Medicine | Admitting: Emergency Medicine

## 2012-02-28 DIAGNOSIS — R112 Nausea with vomiting, unspecified: Secondary | ICD-10-CM | POA: Insufficient documentation

## 2012-02-28 DIAGNOSIS — F17200 Nicotine dependence, unspecified, uncomplicated: Secondary | ICD-10-CM | POA: Insufficient documentation

## 2012-02-28 DIAGNOSIS — N200 Calculus of kidney: Secondary | ICD-10-CM | POA: Insufficient documentation

## 2012-02-28 DIAGNOSIS — R319 Hematuria, unspecified: Secondary | ICD-10-CM | POA: Insufficient documentation

## 2012-02-28 DIAGNOSIS — M549 Dorsalgia, unspecified: Secondary | ICD-10-CM | POA: Insufficient documentation

## 2012-02-28 LAB — URINALYSIS, ROUTINE W REFLEX MICROSCOPIC
Glucose, UA: 100 mg/dL — AB
Ketones, ur: 15 mg/dL — AB
pH: 8 (ref 5.0–8.0)

## 2012-02-28 LAB — URINE MICROSCOPIC-ADD ON

## 2012-02-28 MED ORDER — HYDROMORPHONE HCL PF 1 MG/ML IJ SOLN
1.0000 mg | Freq: Once | INTRAMUSCULAR | Status: AC
  Administered 2012-02-28: 1 mg via INTRAVENOUS
  Filled 2012-02-28: qty 1

## 2012-02-28 MED ORDER — KETOROLAC TROMETHAMINE 30 MG/ML IJ SOLN
30.0000 mg | Freq: Once | INTRAMUSCULAR | Status: AC
  Administered 2012-02-28: 30 mg via INTRAVENOUS
  Filled 2012-02-28: qty 1

## 2012-02-28 MED ORDER — OXYCODONE-ACETAMINOPHEN 5-325 MG PO TABS: 1.0000 | ORAL_TABLET | Freq: Four times a day (QID) | ORAL | Status: DC | PRN

## 2012-02-28 MED ORDER — MORPHINE SULFATE 4 MG/ML IJ SOLN
4.0000 mg | Freq: Once | INTRAMUSCULAR | Status: AC
  Administered 2012-02-28: 4 mg via INTRAVENOUS
  Filled 2012-02-28: qty 1

## 2012-02-28 MED ORDER — TAMSULOSIN HCL 0.4 MG PO CAPS: 0.4000 mg | ORAL_CAPSULE | Freq: Every day | ORAL | Status: DC

## 2012-02-28 MED ORDER — ONDANSETRON HCL 4 MG/2ML IJ SOLN
4.0000 mg | Freq: Once | INTRAMUSCULAR | Status: AC
  Administered 2012-02-28: 4 mg via INTRAVENOUS
  Filled 2012-02-28: qty 2

## 2012-02-28 NOTE — ED Notes (Signed)
Pt urine is in blue bin at nurses station.

## 2012-02-28 NOTE — ED Provider Notes (Signed)
History   This chart was scribed for Geoffery Lyons, MD scribed by Magnus Sinning. The patient was seen in room APA12/APA12 at 11:45   CSN: [id number]  Arrival date & time 02/28/12  1058   Chief Complaint  Patient presents with  . Flank Pain  . Hematuria    (Consider location/radiation/quality/duration/timing/severity/associated sxs/prior treatment) The history is provided by the patient. No language interpreter was used.  Kent Goodwin is a 31 y.o. male who presents to the Emergency Department complaining of constant moderate right-sided flank pain, onset this morning with associated hematuria, onset two weeks, nausea, and vomiting. Patient says he has hx of kidney stones, explaining that he has gotten them twice a year for the past 5 years with one being removed last February 2012 at [facility]. He denies abd pain, and bowel problems, but does report hx of back pain.   Past Medical History  Diagnosis Date  . Kidney stones     Past Surgical History  Procedure Date  . Back surgery     History reviewed. No pertinent family history.  History  Substance Use Topics  . Smoking status: Current Every Day Smoker -- 0.5 packs/day  . Smokeless tobacco: Not on file  . Alcohol Use: No      Review of Systems  Gastrointestinal: Positive for nausea and vomiting. Negative for abdominal pain.  Genitourinary: Positive for hematuria and flank pain.  Musculoskeletal: Positive for back pain.  All other systems reviewed and are negative.    Allergies  Review of patient's allergies indicates no known allergies.  Home Medications   Current Outpatient Rx  Name Route Sig Dispense Refill  . ACETAMINOPHEN 500 MG PO TABS Oral Take 1,000 mg by mouth every 6 (six) hours as needed. Pain      BP 143/87  Pulse 88  Temp 97.6 F (36.4 C) (Oral)  Resp 20  Ht 6\' 2"  (1.88 m)  Wt 198 lb (89.812 kg)  BMI 25.42 kg/m2  SpO2 98%  Physical Exam  Nursing note and vitals  reviewed. Constitutional: He is oriented to person, place, and time. He appears well-developed and well-nourished. No distress.  HENT:  Head: Normocephalic and atraumatic.  Eyes: Conjunctivae normal and EOM are normal. Pupils are equal, round, and reactive to light.  Neck: Normal range of motion. Neck supple. No tracheal deviation present.  Cardiovascular: Normal rate and regular rhythm.   Pulmonary/Chest: Effort normal and breath sounds normal. No respiratory distress. He has no wheezes. He has no rales.  Abdominal: He exhibits no distension. There is tenderness. There is no rebound and no guarding.       There is right sided CVA tenderness. There is minimal right lower quadrant tenderness to palpation  Musculoskeletal: Normal range of motion. He exhibits no edema.  Neurological: He is alert and oriented to person, place, and time. No sensory deficit.  Skin: Skin is warm and dry.  Psychiatric: He has a normal mood and affect. His behavior is normal.    ED Course  Procedures (including critical care time) DIAGNOSTIC STUDIES: Oxygen Saturation is 98% on room air, normal by my interpretation.    COORDINATION OF CARE: 11:47: Physical exam performed.  Labs Reviewed  URINALYSIS, ROUTINE W REFLEX MICROSCOPIC - Abnormal; Notable for the following:    Color, Urine RED (*)  BIOCHEMICALS MAY BE AFFECTED BY COLOR   APPearance CLOUDY (*)     Glucose, UA 100 (*)     Hgb urine dipstick LARGE (*)  Ketones, ur 15 (*)     Protein, ur >300 (*)     Urobilinogen, UA 4.0 (*)     Nitrite POSITIVE (*)     Leukocytes, UA MODERATE (*)     All other components within normal limits  URINE MICROSCOPIC-ADD ON - Abnormal; Notable for the following:    Bacteria, UA MANY (*)     All other components within normal limits  URINE CULTURE   Ct Abdomen Pelvis Wo Contrast  02/28/2012  *RADIOLOGY REPORT*  Clinical Data: Right flank pain.  History of kidney stones.  CT ABDOMEN AND PELVIS WITHOUT CONTRAST   Technique:  Multidetector CT imaging of the abdomen and pelvis was performed following the standard protocol without intravenous contrast.  Comparison: Abdominal pelvic CT 01/10/2011.  Findings: There is a tiny calculus in the upper pole of the right kidney.  There are small calculi in the interpolar and lower pole of the left kidney.  Overall stone burden has decreased compared with the prior study.  There is new right-sided hydronephrosis and perinephric soft tissue stranding secondary to a 4 mm calculus in the proximal right ureter.  This is seen on image number 40 and is located at the level of the L4 vertebral body.  This is not clearly seen on the scout image.  The distal ureters are decompressed.  There is no evidence of bladder calculus.  There is increased density within the bladder lumen attributed to hemorrhage.  The lung bases are clear and there is no pleural effusion.  As evaluated in the noncontrast state, the liver, spleen, gallbladder, pancreas and adrenal glands appear normal.  The bowel gas pattern is normal.  The appendix appears normal.  The patient is status post L5-S1 fusion.  The hardware appears intact.  A sclerotic lesion within the T12 vertebral body is unchanged.  IMPRESSION:  1.  Obstructing 4 mm calculus in the proximal right ureter as described. 2.  Bilateral nephrolithiasis with decreased stone burden compared with prior study.   Original Report Authenticated By: Gerrianne Scale, M.D.      No diagnosis found.    MDM  The ct scan shows a 4 mm proximal ureteral stone.  He is feeling better with meds given.  He will be discharged to home with flomax, percocet, and follow up with his Urologist next week if not improving.     I personally performed the services described in this documentation, which was scribed in my presence. The recorded information has been reviewed and considered.           Geoffery Lyons, MD 02/28/12 1329

## 2012-02-28 NOTE — ED Notes (Signed)
Pt c/o right flank pain/hematuria/n/v since 0800 this am.

## 2012-02-29 LAB — URINE CULTURE

## 2012-04-08 ENCOUNTER — Emergency Department (HOSPITAL_COMMUNITY): Payer: Self-pay

## 2012-04-08 ENCOUNTER — Emergency Department (HOSPITAL_COMMUNITY)
Admission: EM | Admit: 2012-04-08 | Discharge: 2012-04-08 | Disposition: A | Discharge status: Home / Self Care | Payer: Self-pay | Attending: Emergency Medicine | Admitting: Emergency Medicine

## 2012-04-08 ENCOUNTER — Encounter (HOSPITAL_COMMUNITY): Payer: Self-pay

## 2012-04-08 DIAGNOSIS — Z87442 Personal history of urinary calculi: Secondary | ICD-10-CM | POA: Insufficient documentation

## 2012-04-08 DIAGNOSIS — F17200 Nicotine dependence, unspecified, uncomplicated: Secondary | ICD-10-CM | POA: Insufficient documentation

## 2012-04-08 DIAGNOSIS — R110 Nausea: Secondary | ICD-10-CM | POA: Insufficient documentation

## 2012-04-08 DIAGNOSIS — N200 Calculus of kidney: Secondary | ICD-10-CM | POA: Insufficient documentation

## 2012-04-08 LAB — URINE MICROSCOPIC-ADD ON

## 2012-04-08 LAB — URINALYSIS, ROUTINE W REFLEX MICROSCOPIC
Glucose, UA: NEGATIVE mg/dL
Specific Gravity, Urine: 1.02 (ref 1.005–1.030)

## 2012-04-08 MED ORDER — PROMETHAZINE HCL 25 MG PO TABS: 25.0000 mg | ORAL_TABLET | Freq: Four times a day (QID) | ORAL | Status: DC | PRN

## 2012-04-08 MED ORDER — DEXTROSE 5 % IV SOLN
1.0000 g | Freq: Once | INTRAVENOUS | Status: AC
  Administered 2012-04-08: 1 g via INTRAVENOUS
  Filled 2012-04-08: qty 10

## 2012-04-08 MED ORDER — HYDROMORPHONE HCL PF 1 MG/ML IJ SOLN
1.0000 mg | Freq: Once | INTRAMUSCULAR | Status: AC
  Administered 2012-04-08: 1 mg via INTRAVENOUS
  Filled 2012-04-08: qty 1

## 2012-04-08 MED ORDER — CEPHALEXIN 500 MG PO CAPS: 500.0000 mg | ORAL_CAPSULE | Freq: Four times a day (QID) | ORAL | Status: DC

## 2012-04-08 MED ORDER — HYDROCODONE-ACETAMINOPHEN 5-500 MG PO TABS: 1.0000 | ORAL_TABLET | Freq: Four times a day (QID) | ORAL | Status: DC | PRN

## 2012-04-08 MED ORDER — ONDANSETRON HCL 4 MG/2ML IJ SOLN
4.0000 mg | Freq: Once | INTRAMUSCULAR | Status: AC
  Administered 2012-04-08: 4 mg via INTRAVENOUS
  Filled 2012-04-08: qty 2

## 2012-04-08 MED ORDER — KETOROLAC TROMETHAMINE 30 MG/ML IJ SOLN
30.0000 mg | Freq: Once | INTRAMUSCULAR | Status: AC
  Administered 2012-04-08: 30 mg via INTRAVENOUS
  Filled 2012-04-08: qty 1

## 2012-04-08 NOTE — ED Provider Notes (Signed)
History     CSN: [id number]  Arrival date & time 04/08/12  0020   First MD Initiated Contact with Patient 04/08/12 0032      Chief Complaint  Patient presents with  . Flank Pain    (Consider location/radiation/quality/duration/timing/severity/associated sxs/prior treatment) HPI History provided by patient. Was evaluated here a few weeks ago for a kidney stone on the right side. Had been doing well today when he developed return of right flank pain radiating to right lower quadrant abdomen. You develop nausea no vomiting. He denies any recent fevers or chills. Pain has been constant and severe. No known alleviating factors. He is previously been evaluated by urology and required stents for kidney stones. Patient is worried he may have another stone. He has had persistent hematuria. No dysuria. No back pain. Past Medical History  Diagnosis Date  . Kidney stones   . Kidney stone   . Kidney stone     Past Surgical History  Procedure Date  . Back surgery     No family history on file.  History  Substance Use Topics  . Smoking status: Current Every Day Smoker -- 0.5 packs/day  . Smokeless tobacco: Not on file  . Alcohol Use: No      Review of Systems  Constitutional: Negative for fever and chills.  HENT: Negative for neck pain and neck stiffness.   Eyes: Negative for pain.  Respiratory: Negative for shortness of breath.   Cardiovascular: Negative for chest pain.  Gastrointestinal: Positive for nausea. Negative for vomiting and abdominal pain.  Genitourinary: Positive for flank pain. Negative for dysuria.  Musculoskeletal: Negative for back pain.  Skin: Negative for rash.  Neurological: Negative for headaches.  All other systems reviewed and are negative.    Allergies  Review of patient's allergies indicates no known allergies.  Home Medications   Current Outpatient Rx  Name  Route  Sig  Dispense  Refill  . ACETAMINOPHEN 500 MG PO TABS   Oral   Take 1,000 mg  by mouth every 6 (six) hours as needed. Pain         . OXYCODONE-ACETAMINOPHEN 5-325 MG PO TABS   Oral   Take 1-2 tablets by mouth every 6 (six) hours as needed for pain.   25 tablet   0   . TAMSULOSIN HCL 0.4 MG PO CAPS   Oral   Take 1 capsule (0.4 mg total) by mouth daily.   7 capsule   1     BP 147/94  Pulse 97  Temp 98.6 F (37 C) (Oral)  Resp 18  SpO2 99%  Physical Exam  Constitutional: He is oriented to person, place, and time. He appears well-developed and well-nourished.  HENT:  Head: Normocephalic and atraumatic.  Eyes: EOM are normal. Pupils are equal, round, and reactive to light.  Neck: Neck supple.  Cardiovascular: Normal rate, regular rhythm and intact distal pulses.   Pulmonary/Chest: Effort normal and breath sounds normal. No respiratory distress.  Abdominal: Soft. Bowel sounds are normal. He exhibits no distension. There is no tenderness. There is no rebound and no guarding.       Localizes pain to right flank without any reproducible tenderness. No CVA tenderness  Musculoskeletal: Normal range of motion. He exhibits no edema.  Neurological: He is alert and oriented to person, place, and time.  Skin: Skin is warm and dry.    ED Course  Procedures (including critical care time)  Labs Reviewed  URINALYSIS, ROUTINE W REFLEX MICROSCOPIC -  Abnormal; Notable for the following:    Color, Urine RED (*)  BIOCHEMICALS MAY BE AFFECTED BY COLOR   APPearance CLOUDY (*)     Hgb urine dipstick LARGE (*)     Bilirubin Urine SMALL (*)     Ketones, ur TRACE (*)     Protein, ur 100 (*)     Nitrite POSITIVE (*)     Leukocytes, UA SMALL (*)     All other components within normal limits  URINE MICROSCOPIC-ADD ON   Ct Abdomen Pelvis Wo Contrast  04/08/2012  *RADIOLOGY REPORT*  Clinical Data: Right flank pain.  Urolithiasis.  CT ABDOMEN AND PELVIS WITHOUT CONTRAST  Technique:  Multidetector CT imaging of the abdomen and pelvis was performed following the standard  protocol without intravenous contrast.  Comparison: 02/28/2012  Findings: Previously seen 4 mm proximal right ureteral calculus has now migrated into the distal portion of the left ureter.  Moderate right hydronephrosis shows mild increase since previous study. Tiny less than 5 mm intrarenal calculi are again seen bilaterally. No evidence of left-sided hydronephrosis.  The other abdominal parenchymal organs are unremarkable in appearance on this noncontrast study.  No soft tissue masses or lymphadenopathy identified.  No evidence of inflammatory process or abnormal fluid collections.  Normal appendix is visualized.  IMPRESSION:  1.  4 mm right ureteral calculus has migrated into the distal portion of the ureter.  Further increase in moderate right hydronephrosis. 2.  Tiny nonobstructing bilateral intrarenal calculi.   Original Report Authenticated By: Myles Rosenthal, M.D.     IV fluids. IV Dilaudid. CT scan obtained and reviewed as above.  IV Rocephin provided for nitrite positive UA. No significant white cells or bacteria. No UTI symptoms.  Pain improved and stable for discharge home. Urology referral provided. Patient states he cannot afford Flomax, agrees to other prescriptions. Kidney stone precautions verbalized as understood   MDM  Flank pain with kidney stone. Improved with IV narcotics. IV fluids provided. CT scan reviewed as above. UA reviewed. Vital signs and nursing notes and old records reviewed and considered.        Sunnie Nielsen, MD 04/08/12 [phone]

## 2012-04-08 NOTE — ED Notes (Signed)
Pt waiting in room for ride to arrive.

## 2012-04-08 NOTE — ED Notes (Signed)
PT states he is having right sided flank pain that started this morning and got progressively worse throughout the day. Pt states he has had kidney stones in the past. States this feels like one

## 2012-04-08 NOTE — ED Notes (Signed)
Pt c/o right flank pain that started this morning. Pt has Hx of kidney stones.

## 2012-04-17 ENCOUNTER — Emergency Department (HOSPITAL_COMMUNITY): Payer: Self-pay

## 2012-04-17 ENCOUNTER — Encounter (HOSPITAL_COMMUNITY): Payer: Self-pay | Admitting: *Deleted

## 2012-04-17 ENCOUNTER — Emergency Department (HOSPITAL_COMMUNITY)
Admission: EM | Admit: 2012-04-17 | Discharge: 2012-04-17 | Disposition: A | Discharge status: Home / Self Care | Payer: Self-pay | Attending: Emergency Medicine | Admitting: Emergency Medicine

## 2012-04-17 DIAGNOSIS — N200 Calculus of kidney: Secondary | ICD-10-CM

## 2012-04-17 DIAGNOSIS — R300 Dysuria: Secondary | ICD-10-CM | POA: Insufficient documentation

## 2012-04-17 DIAGNOSIS — R319 Hematuria, unspecified: Secondary | ICD-10-CM | POA: Insufficient documentation

## 2012-04-17 DIAGNOSIS — F17200 Nicotine dependence, unspecified, uncomplicated: Secondary | ICD-10-CM | POA: Insufficient documentation

## 2012-04-17 DIAGNOSIS — Z87442 Personal history of urinary calculi: Secondary | ICD-10-CM | POA: Insufficient documentation

## 2012-04-17 DIAGNOSIS — R110 Nausea: Secondary | ICD-10-CM | POA: Insufficient documentation

## 2012-04-17 LAB — URINALYSIS, ROUTINE W REFLEX MICROSCOPIC
Glucose, UA: NEGATIVE mg/dL
Ketones, ur: NEGATIVE mg/dL
Leukocytes, UA: NEGATIVE
Protein, ur: NEGATIVE mg/dL
Urobilinogen, UA: 0.2 mg/dL (ref 0.0–1.0)

## 2012-04-17 LAB — POCT I-STAT, CHEM 8
BUN: 13 mg/dL (ref 6–23)
Calcium, Ion: 1.18 mmol/L (ref 1.12–1.23)
Creatinine, Ser: 1 mg/dL (ref 0.50–1.35)
Hemoglobin: 15.3 g/dL (ref 13.0–17.0)
Sodium: 141 mEq/L (ref 135–145)
TCO2: 25 mmol/L (ref 0–100)

## 2012-04-17 LAB — URINE MICROSCOPIC-ADD ON

## 2012-04-17 MED ORDER — HYDROMORPHONE HCL PF 1 MG/ML IJ SOLN
0.5000 mg | Freq: Once | INTRAMUSCULAR | Status: AC
  Administered 2012-04-17: 0.5 mg via INTRAVENOUS
  Filled 2012-04-17: qty 1

## 2012-04-17 MED ORDER — HYDROMORPHONE HCL PF 1 MG/ML IJ SOLN
1.0000 mg | Freq: Once | INTRAMUSCULAR | Status: AC
  Administered 2012-04-17: 1 mg via INTRAVENOUS
  Filled 2012-04-17: qty 1

## 2012-04-17 MED ORDER — ONDANSETRON HCL 4 MG/2ML IJ SOLN
4.0000 mg | Freq: Once | INTRAMUSCULAR | Status: AC
  Administered 2012-04-17: 4 mg via INTRAVENOUS
  Filled 2012-04-17: qty 2

## 2012-04-17 MED ORDER — OXYCODONE-ACETAMINOPHEN 5-325 MG PO TABS: 1.0000 | ORAL_TABLET | Freq: Four times a day (QID) | ORAL | Status: DC | PRN

## 2012-04-17 NOTE — ED Provider Notes (Signed)
Medical screening examination/treatment/procedure(s) were performed by non-physician practitioner and as supervising physician I was immediately available for consultation/collaboration.  Glynn Octave, MD 04/17/12 [phone]

## 2012-04-17 NOTE — ED Notes (Addendum)
Pt c/o pain to right flank, states "I've been trying to pass a kidney stone for 1 month." pt was told if he had not passed stone in 1 week to return. Pt states he has 4mm stone in right side between bladder and kidney. Pt reports now increased pain with urination.

## 2012-04-17 NOTE — ED Notes (Signed)
Patient transported to X-ray 

## 2012-04-17 NOTE — ED Provider Notes (Signed)
History     CSN: [id number]  Arrival date & time 04/17/12  1244   First MD Initiated Contact with Patient 04/17/12 1255      Chief Complaint  Patient presents with  . Flank Pain    (Consider location/radiation/quality/duration/timing/severity/associated sxs/prior treatment) HPI  31 year old male with history of kidney stones require stenting presents complaining of right flank pain.  Patient reports he has had intermittent right flank pain ongoing for over a month. 9 days ago and was seen in the ED for flank pain.  CT scan performed  shows an obstruction 4 mm distal ureteral stone with evidence of UTI.  He was discharge with pain medication, Flomax, and antibiotics and was told to return after a week if his pain has not resolved. 2 days ago he noticed that his flank pain has radiated down to his right groin, and pain has intensified, 8/10 in intensity, since last night. Describe pain as a sharp throbbing sensation with difficulty urinating. Patient endorse nausea without vomiting or diarrhea. He denies fever, chills, abdominal pain, testicular swelling, penile discharge. He has not followup with the urologist.    Past Medical History  Diagnosis Date  . Kidney stones   . Kidney stone   . Kidney stone     Past Surgical History  Procedure Date  . Back surgery     History reviewed. No pertinent family history.  History  Substance Use Topics  . Smoking status: Current Every Day Smoker -- 0.5 packs/day    Types: Cigarettes  . Smokeless tobacco: Not on file  . Alcohol Use: No      Review of Systems  Constitutional: Negative for fever.  Gastrointestinal: Positive for nausea. Negative for vomiting and constipation.  Genitourinary: Positive for hematuria and difficulty urinating. Negative for discharge, penile pain and testicular pain.  Neurological: Negative for numbness.    Allergies  Review of patient's allergies indicates no known allergies.  Home Medications    Current Outpatient Rx  Name  Route  Sig  Dispense  Refill  . ACETAMINOPHEN 500 MG PO TABS   Oral   Take 1,000 mg by mouth every 6 (six) hours as needed. Pain         . CEPHALEXIN 500 MG PO CAPS   Oral   Take 1 capsule (500 mg total) by mouth 4 (four) times daily.   28 capsule   0   . HYDROCODONE-ACETAMINOPHEN 5-500 MG PO TABS   Oral   Take 1-2 tablets by mouth every 6 (six) hours as needed for pain.   15 tablet   0   . OXYCODONE-ACETAMINOPHEN 5-325 MG PO TABS   Oral   Take 1-2 tablets by mouth every 6 (six) hours as needed for pain.   25 tablet   0   . PROMETHAZINE HCL 25 MG PO TABS   Oral   Take 1 tablet (25 mg total) by mouth every 6 (six) hours as needed for nausea.   30 tablet   0   . TAMSULOSIN HCL 0.4 MG PO CAPS   Oral   Take 1 capsule (0.4 mg total) by mouth daily.   7 capsule   1     BP 149/88  Pulse 108  Temp 98.7 F (37.1 C) (Oral)  Resp 18  SpO2 98%  Physical Exam  Nursing note and vitals reviewed. Constitutional: He is oriented to person, place, and time. He appears well-developed and well-nourished. No distress.       Awake, alert, nontoxic  appearance  HENT:  Head: Atraumatic.  Eyes: Conjunctivae normal are normal. Right eye exhibits no discharge. Left eye exhibits no discharge.  Neck: Normal range of motion. Neck supple.  Cardiovascular: Normal rate and regular rhythm.   Pulmonary/Chest: Effort normal. No respiratory distress. He exhibits no tenderness.  Abdominal: Soft. There is no tenderness. There is no rebound.       No CVA tenderness.  Mild tenderness of right groin without signs of hernia.  Genitourinary: Penis normal.       Normal scrotum, testicles nontender  Musculoskeletal: He exhibits no edema and no tenderness.       ROM appears intact, no obvious focal weakness  Neurological: He is alert and oriented to person, place, and time.  Skin: Skin is warm and dry. No rash noted.  Psychiatric: He has a normal mood and affect.     ED Course  Procedures (including critical care time)  Labs Reviewed - No data to display No results found.   No diagnosis found.  Results for orders placed during the hospital encounter of 04/17/12  URINALYSIS, ROUTINE W REFLEX MICROSCOPIC      Component Value Range   Color, Urine YELLOW  YELLOW   APPearance CLEAR  CLEAR   Specific Gravity, Urine 1.017  1.005 - 1.030   pH 7.0  5.0 - 8.0   Glucose, UA NEGATIVE  NEGATIVE mg/dL   Hgb urine dipstick LARGE (*) NEGATIVE   Bilirubin Urine NEGATIVE  NEGATIVE   Ketones, ur NEGATIVE  NEGATIVE mg/dL   Protein, ur NEGATIVE  NEGATIVE mg/dL   Urobilinogen, UA 0.2  0.0 - 1.0 mg/dL   Nitrite NEGATIVE  NEGATIVE   Leukocytes, UA NEGATIVE  NEGATIVE  POCT I-STAT, CHEM 8      Component Value Range   Sodium 141  135 - 145 mEq/L   Potassium 3.9  3.5 - 5.1 mEq/L   Chloride 107  96 - 112 mEq/L   BUN 13  6 - 23 mg/dL   Creatinine, Ser 1.61  0.50 - 1.35 mg/dL   Glucose, Bld 88  70 - 99 mg/dL   Calcium, Ion 0.96  0.45 - 1.23 mmol/L   TCO2 25  0 - 100 mmol/L   Hemoglobin 15.3  13.0 - 17.0 g/dL   HCT 40.9  81.1 - 91.4 %  URINE MICROSCOPIC-ADD ON      Component Value Range   WBC, UA 0-2  <3 WBC/hpf   RBC / HPF TOO NUMEROUS TO COUNT  <3 RBC/hpf   Ct Abdomen Pelvis Wo Contrast  04/08/2012  *RADIOLOGY REPORT*  Clinical Data: Right flank pain.  Urolithiasis.  CT ABDOMEN AND PELVIS WITHOUT CONTRAST  Technique:  Multidetector CT imaging of the abdomen and pelvis was performed following the standard protocol without intravenous contrast.  Comparison: 02/28/2012  Findings: Previously seen 4 mm proximal right ureteral calculus has now migrated into the distal portion of the left ureter.  Moderate right hydronephrosis shows mild increase since previous study. Tiny less than 5 mm intrarenal calculi are again seen bilaterally. No evidence of left-sided hydronephrosis.  The other abdominal parenchymal organs are unremarkable in appearance on this noncontrast  study.  No soft tissue masses or lymphadenopathy identified.  No evidence of inflammatory process or abnormal fluid collections.  Normal appendix is visualized.  IMPRESSION:  1.  4 mm right ureteral calculus has migrated into the distal portion of the ureter.  Further increase in moderate right hydronephrosis. 2.  Tiny nonobstructing bilateral intrarenal calculi.  Original Report Authenticated By: Myles Rosenthal, M.D.    Dg Abd 1 View  04/17/2012  *RADIOLOGY REPORT*  Clinical Data: Right-sided flank pain history of renal calculi  ABDOMEN - 1 VIEW  Comparison: CT abdomen 04/08/2012  Findings: The distal right ureteral calculus has migrated inferiorly several centimeters and now lies in the expected location of the right vesicoureteral junction.  No nephrolithiasis noted.  Posterior lumbar fusion.  IMPRESSION: Interval migration of the distal right ureteral calculus to the level of the vesicoureteral junction.   Original Report Authenticated By: Genevive Bi, M.D.    US Renal  04/17/2012  *RADIOLOGY REPORT*  Clinical Data: Right flank pain  RENAL/URINARY TRACT ULTRASOUND COMPLETE  Comparison:  CT abdomen/pelvis 04/08/2012  Findings:  Right Kidney:  Mild - moderate pelvicaliectasis.  The kidney itself is within normal limits for size at 10.7 cm in length.  No focal solid lesion identified.  Normal renal parenchymal echogenicity.  Left Kidney:  Within normal limits for length at 11.2 cm.  No focal solid mass.  Renal parenchymal echogenicity within normal limits. No hydronephrosis.  7 mm echogenic focus at the junction of the inter and lower pole regions consistent with renal calculus.  Bladder:  Within normal limits.  IMPRESSION:  1.  Mild - moderate right hydronephrosis.  Comparing across modalities to the recent CT scan, the degree of hydronephrosis appears similar to slightly improved.  2.  Nonobstructing left nephrolithiasis   Original Report Authenticated By: Malachy Moan, M.D.     1. Kidney stone,  right  MDM  R flank pain which has migrate to R groin.  Exam unremarkable. Has 4mm obstructed uteral stone to distal ureter with hydronephrosis which were seen 9 days ago.  Plan to give pain meds, recheck UA, check renal function.     2:20 PM Will obtain KUB to look for stone, if positive will obtain US to look for hydronephrosis, if positive will consider CT   4:12 PM UA shows no evidence of urinary tract infection. Pelvic ultrasound shows evidence of hydronephrosis but shows improvement.  Therefore, will not need further CT scan.  Will d/c with pain meds and f/u with Urologist for further care.  Patient voiced understanding and agrees with plan.  BP 124/76  Pulse 92  Temp 98.7 F (37.1 C) (Oral)  Resp 16  SpO2 97%  I have reviewed nursing notes and vital signs. I personally reviewed the imaging tests through PACS system  I reviewed available ER/hospitalization records thought the EMR   Fayrene Helper, PA-C 04/17/12 1626

## 2012-10-09 ENCOUNTER — Emergency Department (HOSPITAL_COMMUNITY): Payer: BC Managed Care – PPO

## 2012-10-09 ENCOUNTER — Encounter (HOSPITAL_COMMUNITY): Payer: Self-pay | Admitting: *Deleted

## 2012-10-09 ENCOUNTER — Emergency Department (HOSPITAL_COMMUNITY)
Admission: EM | Admit: 2012-10-09 | Discharge: 2012-10-09 | Disposition: A | Discharge status: Home / Self Care | Payer: BC Managed Care – PPO | Attending: Emergency Medicine | Admitting: Emergency Medicine

## 2012-10-09 DIAGNOSIS — Y9302 Activity, running: Secondary | ICD-10-CM | POA: Insufficient documentation

## 2012-10-09 DIAGNOSIS — R209 Unspecified disturbances of skin sensation: Secondary | ICD-10-CM | POA: Insufficient documentation

## 2012-10-09 DIAGNOSIS — Z87442 Personal history of urinary calculi: Secondary | ICD-10-CM | POA: Insufficient documentation

## 2012-10-09 DIAGNOSIS — X500XXA Overexertion from strenuous movement or load, initial encounter: Secondary | ICD-10-CM | POA: Insufficient documentation

## 2012-10-09 DIAGNOSIS — S93409A Sprain of unspecified ligament of unspecified ankle, initial encounter: Secondary | ICD-10-CM | POA: Insufficient documentation

## 2012-10-09 DIAGNOSIS — F17200 Nicotine dependence, unspecified, uncomplicated: Secondary | ICD-10-CM | POA: Insufficient documentation

## 2012-10-09 DIAGNOSIS — S93401A Sprain of unspecified ligament of right ankle, initial encounter: Secondary | ICD-10-CM

## 2012-10-09 DIAGNOSIS — Y9289 Other specified places as the place of occurrence of the external cause: Secondary | ICD-10-CM | POA: Insufficient documentation

## 2012-10-09 MED ORDER — ONDANSETRON HCL 4 MG PO TABS
4.0000 mg | ORAL_TABLET | Freq: Once | ORAL | Status: AC
  Administered 2012-10-09: 4 mg via ORAL
  Filled 2012-10-09: qty 1

## 2012-10-09 MED ORDER — HYDROCODONE-ACETAMINOPHEN 5-325 MG PO TABS
2.0000 | ORAL_TABLET | Freq: Once | ORAL | Status: AC
  Administered 2012-10-09: 2 via ORAL
  Filled 2012-10-09: qty 2

## 2012-10-09 MED ORDER — KETOROLAC TROMETHAMINE 10 MG PO TABS
10.0000 mg | ORAL_TABLET | Freq: Once | ORAL | Status: AC
  Administered 2012-10-09: 10 mg via ORAL
  Filled 2012-10-09: qty 1

## 2012-10-09 MED ORDER — HYDROCODONE-ACETAMINOPHEN 7.5-325 MG PO TABS: 1.0000 | ORAL_TABLET | ORAL | Status: DC | PRN

## 2012-10-09 MED ORDER — MELOXICAM 7.5 MG PO TABS: ORAL_TABLET | ORAL | Status: DC

## 2012-10-09 NOTE — ED Notes (Signed)
C/o pain to right foot after tripping today.

## 2012-10-09 NOTE — ED Provider Notes (Signed)
History     CSN: [id number]  Arrival date & time 10/09/12  1726   First MD Initiated Contact with Patient 10/09/12 1753      Chief Complaint  Patient presents with  . Foot Pain    (Consider location/radiation/quality/duration/timing/severity/associated sxs/prior treatment) Patient is a 32 y.o. male presenting with lower extremity pain. The history is provided by the patient.  Foot Pain This is a new problem. The current episode started today. The problem occurs constantly. The problem has been gradually worsening. Associated symptoms include numbness. Pertinent negatives include no abdominal pain, arthralgias, chest pain, coughing or neck pain. The symptoms are aggravated by standing and walking. He has tried ice and lying down for the symptoms. The treatment provided no relief.    Past Medical History  Diagnosis Date  . Kidney stones   . Kidney stone   . Kidney stone     Past Surgical History  Procedure Laterality Date  . Back surgery      No family history on file.  History  Substance Use Topics  . Smoking status: Current Every Day Smoker -- 0.50 packs/day    Types: Cigarettes  . Smokeless tobacco: Not on file  . Alcohol Use: No      Review of Systems  Constitutional: Negative for activity change.       All ROS Neg except as noted in HPI  HENT: Negative for nosebleeds and neck pain.   Eyes: Negative for photophobia and discharge.  Respiratory: Negative for cough, shortness of breath and wheezing.   Cardiovascular: Negative for chest pain and palpitations.  Gastrointestinal: Negative for abdominal pain and blood in stool.  Genitourinary: Negative for dysuria, frequency and hematuria.  Musculoskeletal: Negative for back pain and arthralgias.  Skin: Negative.   Neurological: Positive for numbness. Negative for dizziness, seizures and speech difficulty.  Psychiatric/Behavioral: Negative for hallucinations and confusion.    Allergies  Review of patient's  allergies indicates no known allergies.  Home Medications  No current outpatient prescriptions on file.  BP 132/85  Pulse 83  Temp(Src) 97.8 F (36.6 C) (Oral)  Resp 18  Ht 6\' 2"  (1.88 m)  Wt 198 lb (89.812 kg)  BMI 25.41 kg/m2  SpO2 97%  Physical Exam  Nursing note and vitals reviewed. Constitutional: He is oriented to person, place, and time. He appears well-developed and well-nourished.  Non-toxic appearance.  HENT:  Head: Normocephalic.  Right Ear: Tympanic membrane and external ear normal.  Left Ear: Tympanic membrane and external ear normal.  Eyes: EOM and lids are normal. Pupils are equal, round, and reactive to light.  Neck: Normal range of motion. Neck supple. Carotid bruit is not present.  Cardiovascular: Normal rate, regular rhythm, normal heart sounds, intact distal pulses and normal pulses.   Pulmonary/Chest: Breath sounds normal. No respiratory distress.  Abdominal: Soft. Bowel sounds are normal. There is no tenderness. There is no guarding.  Musculoskeletal: Normal range of motion.  Rt lateral malleolus swelling and tenderness noted. Achilles intact. FROM of the toes. DP and PT pulses 2+ and symmetrical.  Lymphadenopathy:       Head (right side): No submandibular adenopathy present.       Head (left side): No submandibular adenopathy present.    He has no cervical adenopathy.  Neurological: He is alert and oriented to person, place, and time. He has normal strength. No cranial nerve deficit or sensory deficit.  Skin: Skin is warm and dry.  Psychiatric: He has a normal mood and affect. His  speech is normal.    ED Course  Procedures (including critical care time)  Labs Reviewed - No data to display Dg Foot Complete Right  10/09/2012   *RADIOLOGY REPORT*  Clinical Data: Larey Seat. The patient has a knot on the anterior portion of the right foot.  RIGHT FOOT COMPLETE - 3+ VIEW  Comparison: None  Findings: There is no evidence for acute fracture or dislocation. No soft  tissue foreign body or gas identified.  IMPRESSION: Negative exam.   Original Report Authenticated By: Norva Pavlov, M.D.     No diagnosis found.    MDM  I have reviewed nursing notes, vital signs, and all appropriate lab and imaging results for this patient. Pt was running, stepped on a rock and twisted the right ankle. He has pain since that time.  Xray of the right foot is negative for fracture or dislocation.       Kathie Dike, PA-C 10/09/12 1812

## 2012-10-09 NOTE — ED Provider Notes (Signed)
Medical screening examination/treatment/procedure(s) were performed by non-physician practitioner and as supervising physician I was immediately available for consultation/collaboration.   Chancelor Hardrick L Rian Koon, MD 10/09/12 2337 

## 2013-04-18 ENCOUNTER — Encounter (HOSPITAL_COMMUNITY): Payer: Self-pay | Admitting: Emergency Medicine

## 2013-04-18 ENCOUNTER — Emergency Department (HOSPITAL_COMMUNITY): Payer: BC Managed Care – PPO

## 2013-04-18 ENCOUNTER — Emergency Department (HOSPITAL_COMMUNITY)
Admission: EM | Admit: 2013-04-18 | Discharge: 2013-04-18 | Disposition: A | Discharge status: Home / Self Care | Payer: BC Managed Care – PPO | Attending: Emergency Medicine | Admitting: Emergency Medicine

## 2013-04-18 DIAGNOSIS — F17200 Nicotine dependence, unspecified, uncomplicated: Secondary | ICD-10-CM | POA: Insufficient documentation

## 2013-04-18 DIAGNOSIS — R630 Anorexia: Secondary | ICD-10-CM | POA: Insufficient documentation

## 2013-04-18 DIAGNOSIS — N200 Calculus of kidney: Secondary | ICD-10-CM | POA: Insufficient documentation

## 2013-04-18 DIAGNOSIS — R109 Unspecified abdominal pain: Secondary | ICD-10-CM

## 2013-04-18 LAB — URINALYSIS, ROUTINE W REFLEX MICROSCOPIC
Bilirubin Urine: NEGATIVE
Ketones, ur: NEGATIVE mg/dL
Specific Gravity, Urine: >1.03 — ABNORMAL HIGH (ref 1.005–1.030)
pH: 6 (ref 5.0–8.0)

## 2013-04-18 LAB — CBC WITH DIFFERENTIAL/PLATELET
Basophils Relative: 0 % (ref 0–1)
Eosinophils Absolute: 0.2 10*3/uL (ref 0.0–0.7)
Eosinophils Relative: 2 % (ref 0–5)
HCT: 45.8 % (ref 39.0–52.0)
Hemoglobin: 15.7 g/dL (ref 13.0–17.0)
Lymphs Abs: 3.1 10*3/uL (ref 0.7–4.0)
MCH: 30 pg (ref 26.0–34.0)
MCHC: 34.3 g/dL (ref 30.0–36.0)
MCV: 87.6 fL (ref 78.0–100.0)
Monocytes Absolute: 0.9 10*3/uL (ref 0.1–1.0)
Monocytes Relative: 8 % (ref 3–12)
RBC: 5.23 MIL/uL (ref 4.22–5.81)

## 2013-04-18 LAB — COMPREHENSIVE METABOLIC PANEL
Alkaline Phosphatase: 101 U/L (ref 39–117)
BUN: 16 mg/dL (ref 6–23)
Calcium: 9.6 mg/dL (ref 8.4–10.5)
Creatinine, Ser: 1.05 mg/dL (ref 0.50–1.35)
GFR calc Af Amer: >90 mL/min (ref >90)
Glucose, Bld: 118 mg/dL — ABNORMAL HIGH (ref 70–99)
Potassium: 4 mEq/L (ref 3.5–5.1)
Total Protein: 8 g/dL (ref 6.0–8.3)

## 2013-04-18 LAB — URINE MICROSCOPIC-ADD ON

## 2013-04-18 MED ORDER — IOHEXOL 300 MG/ML  SOLN
50.0000 mL | Freq: Once | INTRAMUSCULAR | Status: AC | PRN
  Administered 2013-04-18: 50 mL via ORAL

## 2013-04-18 MED ORDER — HYDROMORPHONE HCL PF 1 MG/ML IJ SOLN
1.0000 mg | Freq: Once | INTRAMUSCULAR | Status: AC
  Administered 2013-04-18: 1 mg via INTRAVENOUS
  Filled 2013-04-18: qty 1

## 2013-04-18 MED ORDER — OXYCODONE-ACETAMINOPHEN 5-325 MG PO TABS: 2.0000 | ORAL_TABLET | ORAL | Status: DC | PRN

## 2013-04-18 MED ORDER — SODIUM CHLORIDE 0.9 % IV BOLUS (SEPSIS)
1000.0000 mL | Freq: Once | INTRAVENOUS | Status: AC
  Administered 2013-04-18: 1000 mL via INTRAVENOUS

## 2013-04-18 MED ORDER — SODIUM CHLORIDE 0.9 % IV BOLUS (SEPSIS)
500.0000 mL | Freq: Once | INTRAVENOUS | Status: AC
  Administered 2013-04-18: 500 mL via INTRAVENOUS

## 2013-04-18 MED ORDER — PANTOPRAZOLE SODIUM 20 MG PO TBEC: 20.0000 mg | DELAYED_RELEASE_TABLET | Freq: Every day | ORAL | Status: DC

## 2013-04-18 MED ORDER — PROMETHAZINE HCL 25 MG PO TABS: 25.0000 mg | ORAL_TABLET | Freq: Four times a day (QID) | ORAL | Status: DC | PRN

## 2013-04-18 MED ORDER — ONDANSETRON HCL 4 MG/2ML IJ SOLN
4.0000 mg | Freq: Once | INTRAMUSCULAR | Status: AC
  Administered 2013-04-18: 4 mg via INTRAVENOUS
  Filled 2013-04-18: qty 2

## 2013-04-18 MED ORDER — IOHEXOL 300 MG/ML  SOLN
100.0000 mL | Freq: Once | INTRAMUSCULAR | Status: AC | PRN
  Administered 2013-04-18: 100 mL via INTRAVENOUS

## 2013-04-18 NOTE — ED Notes (Signed)
MD at bedside. 

## 2013-04-18 NOTE — ED Notes (Signed)
Requesting pain medication

## 2013-04-18 NOTE — ED Notes (Signed)
Patient would like something else for pain. RN made aware. 

## 2013-04-18 NOTE — ED Notes (Signed)
Pt voiced concern that pain comes and goes to ruq to rt flank. Pt also reports "kidney stone pain" normally is not in his abdomen. C/o nausea worse with eating and pain worse with eating. Pt has concerns for appendicitis. Dr. Adriana Simas aware.

## 2013-04-18 NOTE — ED Notes (Signed)
Right flank pain starting 2 wks ago, worsening today, c/o nausea.  Denies v/d.  Denies hematuria but reports urinary frequency.

## 2013-04-18 NOTE — ED Provider Notes (Signed)
CSN: [id number]     Arrival date & time 04/18/13  4098 History   This chart was scribed for Donnetta Hutching, MD, by Yevette Edwards, ED Scribe. This patient was seen in room APA12/APA12 and the patient's care was started at 8:08 AM.  First MD Initiated Contact with Patient 04/18/13 0801     Chief Complaint  Patient presents with  . Flank Pain    The history is provided by the patient. No language interpreter was used.   HPI Comments: Kent Goodwin is a 33 y.o. male, with a h/o renal calculi, who presents to the Emergency Department complaining of gradually-increasing right-sided flank pain which began two weeks ago, but which worsened this morning. He reports the pain initially occurred intermittently for intervals of 5-20 minutes, but today the pain has become constant. He characterizes the pain as "sharp and burning," and he rates it as 6/10.  He reports he has had 2-3 renal calculi a year for the past six years; however, he reports the current pain and symptoms are not similar to previous episodes of renal calculi.  As associated symptoms, he has experienced nausea, indigestion, abdominal pain, decreased appetite, and urinary frequency. He reports it seems that his bladder never completely empties, and he reports his urine has been a yellowish-brown color. The pt denies experiencing emesis, diarrhea, or dysuria.   The pt has had several CT scans.  The pt was treated for renal calculi by Dr. Vonita Moss and Dr. Sol Passer at [facility] in [place].   He denies any medication allergies. With previous renal calculi, the pt was treated with Dilaudid for pain control.   He is a daily smoker. Past Medical History  Diagnosis Date  . Kidney stones   . Kidney stone   . Kidney stone    Past Surgical History  Procedure Laterality Date  . Back surgery     No family history on file. History  Substance Use Topics  . Smoking status: Current Every Day Smoker -- 0.50 packs/day    Types:  Cigarettes  . Smokeless tobacco: Not on file  . Alcohol Use: No    Review of Systems  Constitutional: Positive for appetite change.  Gastrointestinal: Positive for nausea and abdominal pain. Negative for vomiting and diarrhea.  Genitourinary: Positive for frequency. Negative for dysuria.  All other systems reviewed and are negative.    Allergies  Review of patient's allergies indicates no known allergies.  Home Medications   Current Outpatient Rx  Name  Route  Sig  Dispense  Refill  . HYDROcodone-acetaminophen (NORCO) 7.5-325 MG per tablet   Oral   Take 1 tablet by mouth every 4 (four) hours as needed for pain.   20 tablet   0   . meloxicam (MOBIC) 7.5 MG tablet      1 po bid with food   12 tablet   0    Triage Vitals: BP 157/107  Pulse 96  Temp(Src) 98.3 F (36.8 C) (Oral)  Resp 16  Ht 6\' 2"  (1.88 m)  Wt 200 lb (90.719 kg)  BMI 25.67 kg/m2  SpO2 98%  Physical Exam  Nursing note and vitals reviewed. Constitutional: He is oriented to person, place, and time. He appears well-developed and well-nourished.  HENT:  Head: Normocephalic and atraumatic.  Eyes: Conjunctivae and EOM are normal. Pupils are equal, round, and reactive to light.  Neck: Normal range of motion. Neck supple.  Cardiovascular: Normal rate, regular rhythm and normal heart sounds.   Pulmonary/Chest: Effort  normal and breath sounds normal.  Abdominal: Soft. Bowel sounds are normal. There is tenderness.  Tenderness to right lateral abdomen.   Musculoskeletal: Normal range of motion.  Neurological: He is alert and oriented to person, place, and time.  Skin: Skin is warm and dry.  Psychiatric: He has a normal mood and affect. His behavior is normal.    ED Course  Procedures (including critical care time)  DIAGNOSTIC STUDIES: Oxygen Saturation is 98% on room air, normal by my interpretation.    COORDINATION OF CARE:  8:13 AM- Discussed treatment plan with patient, which includes imaging, UA,  CBC, and pain medication, and the patient agreed to the plan.   Labs Review Labs Reviewed  URINALYSIS, ROUTINE W REFLEX MICROSCOPIC - Abnormal; Notable for the following:    Specific Gravity, Urine >1.030 (*)    Hgb urine dipstick TRACE (*)    All other components within normal limits  CBC WITH DIFFERENTIAL - Abnormal; Notable for the following:    WBC 11.0 (*)    All other components within normal limits  COMPREHENSIVE METABOLIC PANEL - Abnormal; Notable for the following:    Glucose, Bld 118 (*)    AST 47 (*)    Total Bilirubin 0.2 (*)    All other components within normal limits  URINE MICROSCOPIC-ADD ON   Imaging Review Dg Abd 1 View  04/18/2013   CLINICAL DATA:  Right-sided flank pain, possible kidney stone.  EXAM: ABDOMEN - 1 VIEW  COMPARISON:  04/17/2012.  FINDINGS: Stool is seen throughout the colon. No definite radiopaque urinary calculi. Postoperative changes are seen at L5-S1.  IMPRESSION: 1. No definite radiopaque urinary calculi. 2. Bowel gas pattern is indicative of constipation.   Electronically Signed   By: Leanna Battles M.D.   On: 04/18/2013 08:53   Ct Abdomen Pelvis W Contrast  04/18/2013   CLINICAL DATA:  Right flank pain, abdominal pain.  EXAM: CT ABDOMEN AND PELVIS WITH CONTRAST  TECHNIQUE: Multidetector CT imaging of the abdomen and pelvis was performed using the standard protocol following bolus administration of intravenous contrast.  CONTRAST:  OMNIPAQUE IOHEXOL 300 MG/ML  SOLN  COMPARISON:  04/08/2012  FINDINGS: Lung bases are clear. No effusions. Heart is normal size.  Liver, gallbladder, spleen, pancreas, adrenals are unremarkable.  Small punctate nonobstructing bilateral renal stones. No ureteral stones. No hydronephrosis. Urinary bladder is unremarkable.  Appendix is visualized and is normal. Bowel grossly unremarkable. No free fluid, free air, or adenopathy.  Postsurgical changes in the lower lumbar spine from posterior fusion at L5-S1. No acute bony  abnormality.  IMPRESSION: Bilateral nephrolithiasis.  No ureteral stones or hydronephrosis.   Electronically Signed   By: Charlett Nose M.D.   On: 04/18/2013 12:57    EKG Interpretation   None       MDM  No diagnosis found. No acute abdomen. CT scan shows bilateral nephrolithiasis but no ureteral stones. Small amount of hemoglobin urine. Discharge medications Percocet, Phenergan 25 mg, Protonix 20 mg    I personally performed the services described in this documentation, which was scribed in my presence. The recorded information has been reviewed and is accurate.      Donnetta Hutching, MD 04/18/13 [phone]

## 2014-10-11 ENCOUNTER — Emergency Department (HOSPITAL_COMMUNITY)
Admission: EM | Admit: 2014-10-11 | Discharge: 2014-10-11 | Disposition: A | Discharge status: Home / Self Care | Payer: BLUE CROSS/BLUE SHIELD | Attending: Emergency Medicine | Admitting: Emergency Medicine

## 2014-10-11 ENCOUNTER — Encounter (HOSPITAL_COMMUNITY): Payer: Self-pay | Admitting: Emergency Medicine

## 2014-10-11 DIAGNOSIS — Z9889 Other specified postprocedural states: Secondary | ICD-10-CM | POA: Insufficient documentation

## 2014-10-11 DIAGNOSIS — Z720 Tobacco use: Secondary | ICD-10-CM | POA: Diagnosis not present

## 2014-10-11 DIAGNOSIS — Z7982 Long term (current) use of aspirin: Secondary | ICD-10-CM | POA: Insufficient documentation

## 2014-10-11 DIAGNOSIS — N23 Unspecified renal colic: Secondary | ICD-10-CM | POA: Diagnosis not present

## 2014-10-11 DIAGNOSIS — Z79899 Other long term (current) drug therapy: Secondary | ICD-10-CM | POA: Insufficient documentation

## 2014-10-11 DIAGNOSIS — R319 Hematuria, unspecified: Secondary | ICD-10-CM | POA: Insufficient documentation

## 2014-10-11 DIAGNOSIS — R109 Unspecified abdominal pain: Secondary | ICD-10-CM | POA: Diagnosis present

## 2014-10-11 LAB — URINALYSIS, ROUTINE W REFLEX MICROSCOPIC
Bilirubin Urine: NEGATIVE
Glucose, UA: NEGATIVE mg/dL
LEUKOCYTES UA: NEGATIVE
Nitrite: NEGATIVE
PH: 5 (ref 5.0–8.0)
Protein, ur: 30 mg/dL — AB
Specific Gravity, Urine: >1.03 — ABNORMAL HIGH (ref 1.005–1.030)
Urobilinogen, UA: 0.2 mg/dL (ref 0.0–1.0)

## 2014-10-11 LAB — URINE MICROSCOPIC-ADD ON

## 2014-10-11 MED ORDER — PROMETHAZINE HCL 25 MG PO TABS: 25.0000 mg | ORAL_TABLET | Freq: Four times a day (QID) | ORAL | Status: DC | PRN

## 2014-10-11 MED ORDER — HYDROMORPHONE HCL 2 MG/ML IJ SOLN
2.0000 mg | Freq: Once | INTRAMUSCULAR | Status: AC
  Administered 2014-10-11: 2 mg via INTRAMUSCULAR
  Filled 2014-10-11: qty 1

## 2014-10-11 MED ORDER — OXYCODONE-ACETAMINOPHEN 5-325 MG PO TABS: 1.0000 | ORAL_TABLET | ORAL | Status: DC | PRN

## 2014-10-11 MED ORDER — ONDANSETRON 8 MG PO TBDP
8.0000 mg | ORAL_TABLET | Freq: Once | ORAL | Status: AC
  Administered 2014-10-11: 8 mg via ORAL
  Filled 2014-10-11: qty 1

## 2014-10-11 NOTE — ED Provider Notes (Signed)
CSN: [id number]     Arrival date & time 10/11/14  0840 History   This chart was scribed for Gilda Crease, MD by Lyndel Safe, ED Scribe. This patient was seen in room APA03/APA03 and the patient's care was started 8:49 AM.  Chief Complaint  Patient presents with  . Flank Pain   The history is provided by the patient. No language interpreter was used.  HPI Comments: Kent Goodwin is a 34 y.o. male who presents to the Emergency Department complaining of constant, progressively worsening left sided flank pain onset 1 day ago with associated hematuria for the past 7 days. He states he has noticed both red blood and brownish blood in his urine intermittently for 7 days. He notes a history of approximately 15 kidney stones that presented after back surgery. He denies radiation of the pain to his right side, abdominal pain, fever, or SOB.   Past Medical History  Diagnosis Date  . Kidney stones   . Kidney stone   . Kidney stone    Past Surgical History  Procedure Laterality Date  . Back surgery     History reviewed. No pertinent family history. History  Substance Use Topics  . Smoking status: Current Every Day Smoker -- 0.25 packs/day    Types: Cigarettes  . Smokeless tobacco: Not on file  . Alcohol Use: No    Review of Systems  Constitutional: Negative for fever.  Respiratory: Negative for shortness of breath.   Gastrointestinal: Negative for abdominal pain.  Genitourinary: Positive for hematuria and flank pain.  All other systems reviewed and are negative.  Allergies  Review of patient's allergies indicates no known allergies.  Home Medications   Prior to Admission medications   Medication Sig Start Date End Date Taking? Authorizing Provider  Aspirin-Salicylamide-Caffeine (BC HEADACHE POWDER PO) Take 1 packet by mouth every 6 (six) hours as needed (headache).    Historical Provider, MD  oxyCODONE-acetaminophen (PERCOCET) 5-325 MG per tablet Take 2 tablets by mouth  every 4 (four) hours as needed. 04/18/13   Donnetta Hutching, MD  pantoprazole (PROTONIX) 20 MG tablet Take 1 tablet (20 mg total) by mouth daily. 04/18/13   Donnetta Hutching, MD  promethazine (PHENERGAN) 25 MG tablet Take 1 tablet (25 mg total) by mouth every 6 (six) hours as needed for nausea or vomiting. 04/18/13   Donnetta Hutching, MD   BP 148/103 mmHg  Pulse 87  Temp(Src) 97.6 F (36.4 C) (Oral)  Resp 18  Ht  (1.88 m)  Wt 210 lb (95.255 kg)  BMI 26.95 kg/m2  SpO2 94%   Physical Exam  Constitutional: He is oriented to person, place, and time. He appears well-developed and well-nourished. No distress.  HENT:  Head: Normocephalic and atraumatic.  Right Ear: Hearing normal.  Left Ear: Hearing normal.  Nose: Nose normal.  Mouth/Throat: Oropharynx is clear and moist and mucous membranes are normal.  Eyes: Conjunctivae and EOM are normal. Pupils are equal, round, and reactive to light.  Neck: Normal range of motion. Neck supple.  Cardiovascular: Regular rhythm, S1 normal and S2 normal.  Exam reveals no gallop and no friction rub.   No murmur heard. Pulmonary/Chest: Effort normal and breath sounds normal. No respiratory distress. He exhibits no tenderness.  Abdominal: Soft. Normal appearance and bowel sounds are normal. There is no hepatosplenomegaly. There is no tenderness. There is no rebound, no guarding, no tenderness at McBurney's point and negative Murphy's sign. No hernia.  Musculoskeletal: Normal range of motion.  Neurological:  He is alert and oriented to person, place, and time. He has normal strength. No cranial nerve deficit or sensory deficit. Coordination normal. GCS eye subscore is 4. GCS verbal subscore is 5. GCS motor subscore is 6.  Skin: Skin is warm, dry and intact. No rash noted. No cyanosis.  Psychiatric: He has a normal mood and affect. His speech is normal and behavior is normal. Thought content normal.  Nursing note and vitals reviewed.   ED Course  Procedures  DIAGNOSTIC  STUDIES: Oxygen Saturation is 94% on RA, adequate by my interpretation.    COORDINATION OF CARE: 8:52 AM Discussed treatment plan with pt.  Pt acknowledges and agrees to plan.   Labs Review Labs Reviewed  URINALYSIS, ROUTINE W REFLEX MICROSCOPIC (NOT [facility])    Imaging Review No results found.   EKG Interpretation None      MDM   Final diagnoses:  None   renal colic  Patient presents to the ER for evaluation of left flank pain. Patient reports that he has been noticing blood in his urine intermittently for approximately a week, has now developed severe pain in the flank today. He has had nausea no vomiting. Patient denies frequency and dysuria. No abdominal pain noted. No testicular pain or swelling.  Analysis shows significant hematuria without any signs of infection. Patient reports more than 10 kidney stones passed in the past. He does report that he had stenting one time. Symptoms and urinalysis are very consistent with renal colic. I discussed with the patient and he has had multiple CAT scans in the past. I do not recommend imaging at this time. Ultrasound can confirm hydronephrosis, but might not see the stone. I feel certain that the diagnosis is renal colic, and therefore will treat patient with analgesia, follow up with urology. Patient is counseled return to the ER if he has uncontrolled pain for imaging.  I personally performed the services described in this documentation, which was scribed in my presence. The recorded information has been reviewed and is accurate.     Gilda Creasehristopher J Pollina, MD 10/11/14 [phone]

## 2014-10-11 NOTE — ED Notes (Signed)
Pt is having left flank pain with dark brown urine. Pt denies pain with urination. NAD noted. Pt states he does feel nauseous but denies vomiting. Denies diarrhea.

## 2014-10-11 NOTE — Discharge Instructions (Signed)

## 2014-10-11 NOTE — ED Notes (Signed)
Spouse called and request pt to have a Rx for flomax. EDP aware [facility]

## 2014-10-11 NOTE — ED Notes (Signed)
Pt reports left flank pain radiating to left lower quadrant for last several days. Pt reports nausea denies any fevers,v/d.

## 2014-10-21 ENCOUNTER — Emergency Department (HOSPITAL_COMMUNITY)
Admission: EM | Admit: 2014-10-21 | Discharge: 2014-10-21 | Disposition: A | Discharge status: Home / Self Care | Payer: BLUE CROSS/BLUE SHIELD | Attending: Emergency Medicine | Admitting: Emergency Medicine

## 2014-10-21 ENCOUNTER — Emergency Department (HOSPITAL_COMMUNITY): Payer: BLUE CROSS/BLUE SHIELD

## 2014-10-21 ENCOUNTER — Encounter (HOSPITAL_COMMUNITY): Payer: Self-pay | Admitting: Emergency Medicine

## 2014-10-21 DIAGNOSIS — N201 Calculus of ureter: Secondary | ICD-10-CM | POA: Insufficient documentation

## 2014-10-21 DIAGNOSIS — R109 Unspecified abdominal pain: Secondary | ICD-10-CM | POA: Diagnosis present

## 2014-10-21 DIAGNOSIS — N23 Unspecified renal colic: Secondary | ICD-10-CM

## 2014-10-21 DIAGNOSIS — Z720 Tobacco use: Secondary | ICD-10-CM | POA: Insufficient documentation

## 2014-10-21 LAB — URINE MICROSCOPIC-ADD ON

## 2014-10-21 LAB — URINALYSIS, ROUTINE W REFLEX MICROSCOPIC
BILIRUBIN URINE: NEGATIVE
Glucose, UA: NEGATIVE mg/dL
KETONES UR: NEGATIVE mg/dL
Leukocytes, UA: NEGATIVE
Nitrite: NEGATIVE
Specific Gravity, Urine: 1.015 (ref 1.005–1.030)
UROBILINOGEN UA: 0.2 mg/dL (ref 0.0–1.0)
pH: 6 (ref 5.0–8.0)

## 2014-10-21 MED ORDER — NAPROXEN 250 MG PO TABS: 250.0000 mg | ORAL_TABLET | Freq: Two times a day (BID) | ORAL | Status: DC

## 2014-10-21 MED ORDER — TAMSULOSIN HCL 0.4 MG PO CAPS: 0.4000 mg | ORAL_CAPSULE | Freq: Every day | ORAL | Status: DC

## 2014-10-21 MED ORDER — KETOROLAC TROMETHAMINE 60 MG/2ML IM SOLN
60.0000 mg | Freq: Once | INTRAMUSCULAR | Status: AC
  Administered 2014-10-21: 60 mg via INTRAMUSCULAR
  Filled 2014-10-21: qty 2

## 2014-10-21 MED ORDER — OXYCODONE-ACETAMINOPHEN 5-325 MG PO TABS: ORAL_TABLET | ORAL | Status: DC

## 2014-10-21 MED ORDER — ONDANSETRON 8 MG PO TBDP
8.0000 mg | ORAL_TABLET | Freq: Once | ORAL | Status: AC
  Administered 2014-10-21: 8 mg via ORAL
  Filled 2014-10-21: qty 1

## 2014-10-21 MED ORDER — OXYCODONE-ACETAMINOPHEN 5-325 MG PO TABS
2.0000 | ORAL_TABLET | Freq: Once | ORAL | Status: AC
  Administered 2014-10-21: 2 via ORAL
  Filled 2014-10-21: qty 2

## 2014-10-21 MED ORDER — ONDANSETRON HCL 4 MG PO TABS: 4.0000 mg | ORAL_TABLET | Freq: Three times a day (TID) | ORAL | Status: DC | PRN

## 2014-10-21 NOTE — ED Notes (Signed)
Pt verbalized understanding of no driving and to use caution within 4 hours of taking pain meds due to meds cause drowsiness 

## 2014-10-21 NOTE — ED Provider Notes (Signed)
CSN: [id number]     Arrival date & time 10/21/14  1108 History   First MD Initiated Contact with Patient 10/21/14 1128     Chief Complaint  Patient presents with  . Flank Pain      HPI  Pt was seen at 1140. Per pt, c/o sudden onset and persistence of waxing and waning left sided flank "pain" that began this last evening approximately 2230.  Pt describes the pain as "like my last kidney stone," and radiating into the left side of his abd.  Denies testicular pain/swelling, no dysuria/hematuria, no abd pain, no diarrhea, no black or blood in stools, no CP/SOB.    Past Medical History  Diagnosis Date  . Kidney stones    Past Surgical History  Procedure Laterality Date  . Back surgery      History  Substance Use Topics  . Smoking status: Current Every Day Smoker -- 0.25 packs/day    Types: Cigarettes  . Smokeless tobacco: Not on file  . Alcohol Use: No    Review of Systems ROS: Statement: All systems negative except as marked or noted in the HPI; Constitutional: Negative for fever and chills. ; ; Eyes: Negative for eye pain, redness and discharge. ; ; ENMT: Negative for ear pain, hoarseness, nasal congestion, sinus pressure and sore throat. ; ; Cardiovascular: Negative for chest pain, palpitations, diaphoresis, dyspnea and peripheral edema. ; ; Respiratory: Negative for cough, wheezing and stridor. ; ; Gastrointestinal: Negative for nausea, vomiting, diarrhea, abdominal pain, blood in stool, hematemesis, jaundice and rectal bleeding. . ; ; Genitourinary: +flank pain. Negative for dysuria, and hematuria. ; ; Genital:  No penile drainage or rash, no testicular pain or swelling, no scrotal rash or swelling. ;; Musculoskeletal: Negative for back pain and neck pain. Negative for swelling and trauma.; ; Skin: Negative for pruritus, rash, abrasions, blisters, bruising and skin lesion.; ; Neuro: Negative for headache, lightheadedness and neck stiffness. Negative for weakness, altered level of  consciousness , altered mental status, extremity weakness, paresthesias, involuntary movement, seizure and syncope.      Allergies  Review of patient's allergies indicates no known allergies.  Home Medications   Prior to Admission medications   Medication Sig Start Date End Date Taking? Authorizing Provider  acetaminophen (TYLENOL) 500 MG tablet Take 1,500 mg by mouth every 6 (six) hours as needed for moderate pain.   Yes Historical Provider, MD  ibuprofen (ADVIL,MOTRIN) 200 MG tablet Take 400-600 mg by mouth every 6 (six) hours as needed for moderate pain.   Yes Historical Provider, MD  oxyCODONE-acetaminophen (PERCOCET) 5-325 MG per tablet Take 1-2 tablets by mouth every 4 (four) hours as needed. Patient not taking: Reported on 10/21/2014 10/11/14   Gilda Crease, MD  promethazine (PHENERGAN) 25 MG tablet Take 1 tablet (25 mg total) by mouth every 6 (six) hours as needed for nausea or vomiting. Patient not taking: Reported on 10/21/2014 10/11/14   Gilda Crease, MD   BP 149/98 mmHg  Pulse 90  Temp(Src) 98.1 F (36.7 C) (Oral)  Resp 18  Ht  (1.905 m)  Wt 210 lb (95.255 kg)  BMI 26.25 kg/m2  SpO2 99% Physical Exam 1145: Physical examination:  Nursing notes reviewed; Vital signs and O2 SAT reviewed;  Constitutional: Well developed, Well nourished, Well hydrated, In no acute distress; Head:  Normocephalic, atraumatic; Eyes: EOMI, PERRL, No scleral icterus; ENMT: Mouth and pharynx normal, Mucous membranes moist; Neck: Supple, Full range of motion, No lymphadenopathy; Cardiovascular: Regular rate and  rhythm, No murmur, rub, or gallop; Respiratory: Breath sounds clear & equal bilaterally, No rales, rhonchi, wheezes.  Speaking full sentences with ease, Normal respiratory effort/excursion; Chest: Nontender, Movement normal; Abdomen: Soft, Nontender, Nondistended, Normal bowel sounds; Genitourinary: No CVA tenderness; Extremities: Pulses normal, No tenderness, No edema, No calf  edema or asymmetry.; Neuro: AA&Ox3, Major CN grossly intact.  Speech clear. No gross focal motor or sensory deficits in extremities. Climbs on and off stretcher easily by himself. Gait steady.; Skin: Color normal, Warm, Dry.   ED Course  Procedures     EKG Interpretation None      MDM  MDM Reviewed: previous chart, nursing note and vitals Reviewed previous: labs and CT scan Interpretation: CT scan and labs      Results for orders placed or performed during the hospital encounter of 10/21/14  Urinalysis, Routine w reflex microscopic (not at [facility])  Result Value Ref Range   Color, Urine YELLOW YELLOW   APPearance HAZY (A) CLEAR   Specific Gravity, Urine 1.015 1.005 - 1.030   pH 6.0 5.0 - 8.0   Glucose, UA NEGATIVE NEGATIVE mg/dL   Hgb urine dipstick LARGE (A) NEGATIVE   Bilirubin Urine NEGATIVE NEGATIVE   Ketones, ur NEGATIVE NEGATIVE mg/dL   Protein, ur TRACE (A) NEGATIVE mg/dL   Urobilinogen, UA 0.2 0.0 - 1.0 mg/dL   Nitrite NEGATIVE NEGATIVE   Leukocytes, UA NEGATIVE NEGATIVE  Urine microscopic-add on  Result Value Ref Range   Squamous Epithelial / LPF RARE RARE   WBC, UA 3-6 <3 WBC/hpf   RBC / HPF TOO NUMEROUS TO COUNT <3 RBC/hpf   Bacteria, UA FEW (A) RARE   Ct Renal Stone Study 10/21/2014   CLINICAL DATA:  Left flank pain that radiates to the left groin.  EXAM: CT ABDOMEN AND PELVIS WITHOUT CONTRAST  TECHNIQUE: Multidetector CT imaging of the abdomen and pelvis was performed following the standard protocol without IV contrast.  COMPARISON:  04/18/2013 and 01/10/2011  FINDINGS: There is a subtle nodular density at the right lung base which is unchanged since 2012 and likely benign. Otherwise, the lung bases are clear. Negative for free intraperitoneal air.  There are at least 3 stones in the left kidney lower pole, largest measuring 5 mm. Moderate left hydronephrosis and there is a 5 mm stone in the proximal left ureter. Normal appearance of the urinary bladder. Several  punctate stones in the right kidney without hydronephrosis.  Normal appearance of the adrenal glands. No gross abnormality to the liver, spleen or pancreas. Normal appearance of the gallbladder. Normal appearance of the stomach and duodenum. No significant free fluid or lymphadenopathy within the abdomen or pelvis. Normal appearance of the prostate. There is a retrocecal appendix without acute inflammatory changes. No acute abnormality to the small or large bowel.  Bilateral pedicle screw and rod fixation at L5-S1.  IMPRESSION: Moderate left hydronephrosis due to a 5 mm stone in the proximal left ureter.  Bilateral nephrolithiasis.   Electronically Signed   By: Richarda Overlie M.D.   On: 10/21/2014 12:33    1330:  Feels improved after meds and wants to go home now. Has tol PO well without N/V while in the ED. States he has seen [facility] previously and will f/u with them. Dx and testing d/w pt.  Questions answered.  Verb understanding, agreeable to d/c home with outpt f/u.   Samuel Jester, DO 10/24/14 0745

## 2014-10-21 NOTE — Discharge Instructions (Signed)
°Emergency Department Resource Guide °1) Find a Doctor and Pay Out of Pocket °Although you won't have to find out who is covered by your insurance plan, it is a good idea to ask around and get recommendations. You will then need to call the office and see if the doctor you have chosen will accept you as a new patient and what types of options they offer for patients who are self-pay. Some doctors offer discounts or will set up payment plans for their patients who do not have insurance, but you will need to ask so you aren't surprised when you get to your appointment. ° °2) Contact Your Local Health Department °Not all health departments have doctors that can see patients for sick visits, but many do, so it is worth a call to see if yours does. If you don't know where your local health department is, you can check in your phone book. The CDC also has a tool to help you locate your state's health department, and many state websites also have listings of all of their local health departments. ° °3) Find a Walk-in Clinic °If your illness is not likely to be very severe or complicated, you may want to try a walk in clinic. These are popping up all over the country in pharmacies, drugstores, and shopping centers. They're usually staffed by nurse practitioners or physician assistants that have been trained to treat common illnesses and complaints. They're usually fairly quick and inexpensive. However, if you have serious medical issues or chronic medical problems, these are probably not your best option. ° °No Primary Care Doctor: °- Call [facility] Connect at  832-8000 - they can help you locate a primary care doctor that  accepts your insurance, provides certain services, etc. °- Physician Referral Service- 1-800-533-3463 ° °Chronic Pain Problems: °Organization         Address  Phone   Notes  °[facility]  (336) [phone] Patients need to be referred by their primary care doctor.  ° °Medication  Assistance: °Organization         Address  Phone   Notes  °[place] [facility] [address] °[place], [place] [address] (336) 641-8030 --Must be a resident of [place] °-- Must have NO insurance coverage whatsoever (no Medicaid/ Medicare, etc.) °-- The pt. MUST have a primary care doctor that directs their care regularly and follows them in the community °  °[facility]  (866) 331-1348   °[employer]  (888) 892-1162   ° °Agencies that provide inexpensive medical care: °Organization         Address  Phone   Notes  °[facility]  (336) 832-8035   °[facility]    (336) 832-7272   °[facility] [address] °[place], [place] [address] (336) 832-4777   °[facility] [address], °[place] (336) 271-4999   °[facility]    (336) 373-0678   °[facility]    (336) 272-1050   °[facility] ° [address], [place] Phone:  (336) 832-4444, Fax:  (336) 832-4440 Hours of Operation:  9 am - 6 pm, M-F.  Also accepts Medicaid/Medicare and self-pay.  °[facility] ° [address], [place] Phone: (336) [phone], Fax: (336) [phone]. Hours of Operation:  8:30 am - 5:30 pm, M-F.  Also accepts Medicaid and self-pay.  °[facility] [place] [address], [place] Phone: (336) 878-6027   °[facility] [address], [place], [place] (336)723-1848, Ext. 123 Mondays & Thursdays: 7-9 AM.  First 15 patients are seen on a first come, first serve basis. °  ° °Medicaid-accepting [place] Providers: ° °Organization         Address  Phone   Notes  °[facility] [address], [place] (336) 641-2100 Also accepts self-pay patients.  °[facility] [address], [place] ° (336) 856-9996   °[facility] [address], [place]  (336) 288-8857   °[facility] [address] [place] [address], [place] (336) 299-7000   °Veita Bland [address], [place]  ° (336) 373-1557 Only accepts [place] Access Medicaid patients after they have their name applied to their card.  ° °Self-Pay (no insurance) in [place]: ° °Organization         Address  Phone   Notes  °Sickle Cell Patients, [facility] [address], [place] (336) 832-1970   °[facility] [address], [place] (336) 832-4400   °[facility] ° [address], [place] (336) 992-4800   °[facility]/Dr. Osei-Bonsu ° [address] [place] [address], [place] or [address], [place] (336) [phone] Phone number for both [place] and [place] locations is the same.  °[facility] [address], [place] (336) 299-0000   °[facility] [address] [place] [address], [place] or [address] (336) 852-7530 °(336) 878-2260   °[facility] [address], [place] (336) 350-1642, phone; (336) 294-5005, fax Sees patients 1st and 3rd Saturday of every month.  Must not qualify for public or private insurance (i.e. Medicaid, Medicare, [place] Health Choice, Veterans' Benefits) • Household income should be no more than 200% of the poverty level •The clinic cannot treat you if you are pregnant or think you are pregnant • Sexually transmitted diseases are not treated at the clinic.  ° ° °Dental Care: °Organization         Address  Phone  Notes  °[place] [facility] [address], [place] (336) 641-6152 Accepts children up to age 21 who are enrolled in Medicaid or [place] Health Choice; pregnant women with a Medicaid card; and children who have applied for Medicaid or [place] Health Choice, but were declined, whose parents can pay a reduced fee at time of service.  °[place]  [facility] [place]  [address], [place] (336) 641-7733 Accepts children up to age 21 who are enrolled in Medicaid or [place] Health Choice; pregnant women with a Medicaid card; and children who have applied for Medicaid or [place] Health Choice, but were declined, whose parents can pay a reduced fee at time of service.  °[facility] ° [address], [place] (336) 641-4533 Patients are seen by appointment only. Walk-ins are not accepted. [facility] will see patients 18 years of age and older. °Monday - Tuesday (8am-5pm) °Most Wednesdays (8:30-5pm) °$30 per visit, cash only  °[facility] ° [address], [place] (336) 641-4533 Patients are seen by appointment only. Walk-ins are not accepted. [facility] will see patients 18 years of age and older. °One   Wednesday Evening (Monthly: Volunteer Based).  $30 per visit, cash only  °[facility]  (919) 537-3737 for adults; Children under age 4, call Graduate Pediatric Dentistry at ([phone]) [phone]. Children aged 4-14, please call (919) [phone] to request a pediatric application. ° Dental services are provided in all areas of dental care including fillings, crowns and bridges, complete and partial dentures, implants, gum treatment, root canals, and extractions. Preventive care is also provided. Treatment is provided to both adults and children. °Patients are selected via a lottery and there is often a waiting list. °  °[facility] [address], °[place] ° (336) 763-8833 [email or web address] °  °[facility] [address], [place], [place] (336)723-1848, Ext. 123 Second and Fourth Thursday of each month, opens at 6:30 AM; Clinic ends at 9 AM.  Patients are seen on a first-come first-served basis, and a limited number are seen during each clinic.  ° °[facility] ° [address], [place], [place] (336) [phone]    Eligibility Requirements °You must have lived in [place], or [place] counties for at least the last three months. °  You cannot be eligible for state or federal sponsored healthcare insurance, including Veterans Administration, Medicaid, or Medicare. °  You generally cannot be eligible for healthcare insurance through your employer.  °  How to apply: °Eligibility screenings are held every Tuesday and Wednesday afternoon from 1:00 pm until 4:00 pm. You do not need an appointment for the interview!  °[facility] [address], [place], [place] 336-631-2330   °[facility]  336-342-8273   °[facility]  336-703-3100   °[facility]  336-570-6415   ° °Behavioral Health Resources in the Community: °Intensive Outpatient Programs °Organization         Address  Phone  Notes  °[facility] [address], [place], [place] 336-878-6098   °[facility] [address], [place], [place] 336-832-9800   °[facility] [address], [place], [place] ° 336-882-2125   °[facility] [address],  °[place], [place] 1-800-853-5163 or 336-641-4981   °Substance Abuse Resources °Organization         Address  Phone  Notes  °[facility]  336-882-2125   °[facility]  336-784-9470   °[facility]  336-285-9073   °[facility]  336-845-3988   °Residential & Outpatient Substance Abuse Program  1-800-659-3381   °Psychological Services °Organization         Address  Phone  Notes  °[facility]  336- 832-9600   °[facility]  336- 378-7881   °[facility] [address], [place] 1-800-853-5163 or 336-641-4981   ° °Mobile Crisis Teams °Organization         Address  Phone  Notes  °[facility], Mobile Crisis Care Unit  1-877-626-1772   °Assertive °[facility] ° [address]  [place], [place] 336-834-9664   °Sharon DeEsch [address] °[place] 336-554-5454   ° °Self-Help/Support Groups °Organization         Address  Phone             Notes  °[facility]  - variety of support groups  336- 373-1402 Call for more information  °Narcotics Anonymous (NA), [facility] [address], °[place]  2 meetings at this location  ° °  Residential Treatment Programs Organization         Address  Phone  Notes  [facility] [address],    [place]  6-720-947-0962   [facility]  [address] [place] [address], [place], [place] 476-546-5035   [facility] [address] [place], [place] 465-681-2751 Admissions: 8am-3pm M-F  [facility] [address],    [place], [place] 700-174-9449   [facility] [address] [place], [place], [place] 675-916-3846   [facility] [address],  [place], [place] 659-935-7017   [facility] Intensive Outpatient [address], [place], [place] 793-903-0092   [facility] [address]  [place], [place] 3-300-762-2633 or 667 540 2612   [facility] [address], [place], [place] 937-342-8768 Accepts Medicaid  [facility] [address],  [place] 1-157-262-0355 Substance Abuse/Addiction Treatment   [facility]         Address  Phone  Notes  [facility]  684-679-5433   Angie Fava, PhD [address] [place], [place]   (779) 739-1483 or (936) 097-5036   [facility]   [address] [place], [place] [phone]   [facility] [address] [place], [place] [phone] Insurance/Medicaid/sponsorship through [facility] Families [address]                                    [place], [place] [phone] Therapy/tele-psych/case    [facility] [address], [place] [phone]    Dr. Lolly Mustache  [phone]   [facility]  [employer] [facility]. 1) [address], [place] 2) [address], [place] 3)  [address], [place] [phone]  240-071-1176   [facility] Child Abuse Hotline 509-029-0775 or 226-479-2771 (After Hours)      Take the prescriptions as directed.  Call the Urologist Monday to schedule a follow up appointment in the next 3 days.  Return to the Emergency Department immediately if worsening.

## 2014-10-21 NOTE — ED Notes (Signed)
Pt reports left flank pain radiating to left groin. Pt reports history of kidney stones and reports "passed a stone last week." pt reports hematuria and dysuria since last night.

## 2014-10-23 LAB — URINE CULTURE: CULTURE: NO GROWTH

## 2016-04-08 ENCOUNTER — Emergency Department (HOSPITAL_COMMUNITY): Payer: BLUE CROSS/BLUE SHIELD

## 2016-04-08 ENCOUNTER — Encounter (HOSPITAL_COMMUNITY): Payer: Self-pay | Admitting: *Deleted

## 2016-04-08 ENCOUNTER — Emergency Department (HOSPITAL_COMMUNITY)
Admission: EM | Admit: 2016-04-08 | Discharge: 2016-04-08 | Disposition: A | Discharge status: Home / Self Care | Payer: BLUE CROSS/BLUE SHIELD | Attending: Emergency Medicine | Admitting: Emergency Medicine

## 2016-04-08 DIAGNOSIS — N200 Calculus of kidney: Secondary | ICD-10-CM | POA: Diagnosis not present

## 2016-04-08 DIAGNOSIS — Z791 Long term (current) use of non-steroidal anti-inflammatories (NSAID): Secondary | ICD-10-CM | POA: Diagnosis not present

## 2016-04-08 DIAGNOSIS — Z79899 Other long term (current) drug therapy: Secondary | ICD-10-CM | POA: Insufficient documentation

## 2016-04-08 DIAGNOSIS — R109 Unspecified abdominal pain: Secondary | ICD-10-CM | POA: Diagnosis present

## 2016-04-08 DIAGNOSIS — F17210 Nicotine dependence, cigarettes, uncomplicated: Secondary | ICD-10-CM | POA: Diagnosis not present

## 2016-04-08 LAB — COMPREHENSIVE METABOLIC PANEL
ALBUMIN: 4.7 g/dL (ref 3.5–5.0)
ALK PHOS: 73 U/L (ref 38–126)
ALT: 40 U/L (ref 17–63)
ANION GAP: 7 (ref 5–15)
AST: 49 U/L — ABNORMAL HIGH (ref 15–41)
BUN: 13 mg/dL (ref 6–20)
CO2: 26 mmol/L (ref 22–32)
Calcium: 9.4 mg/dL (ref 8.9–10.3)
Chloride: 105 mmol/L (ref 101–111)
Creatinine, Ser: 0.95 mg/dL (ref 0.61–1.24)
GFR calc Af Amer: >60 mL/min (ref >60)
GFR calc non Af Amer: >60 mL/min (ref >60)
GLUCOSE: 92 mg/dL (ref 65–99)
POTASSIUM: 3.7 mmol/L (ref 3.5–5.1)
SODIUM: 138 mmol/L (ref 135–145)
Total Bilirubin: 0.8 mg/dL (ref 0.3–1.2)
Total Protein: 8.1 g/dL (ref 6.5–8.1)

## 2016-04-08 LAB — URINALYSIS, ROUTINE W REFLEX MICROSCOPIC
Bilirubin Urine: NEGATIVE
Glucose, UA: NEGATIVE mg/dL
NITRITE: NEGATIVE
Protein, ur: 100 mg/dL — AB
SPECIFIC GRAVITY, URINE: 1.015 (ref 1.005–1.030)
pH: 6 (ref 5.0–8.0)

## 2016-04-08 LAB — CBC WITH DIFFERENTIAL/PLATELET
Basophils Absolute: 0 10*3/uL (ref 0.0–0.1)
Basophils Relative: 0 %
Eosinophils Absolute: 0.1 10*3/uL (ref 0.0–0.7)
Eosinophils Relative: 1 %
HCT: 43.7 % (ref 39.0–52.0)
HEMOGLOBIN: 15.1 g/dL (ref 13.0–17.0)
Lymphocytes Relative: 45 %
Lymphs Abs: 3.4 10*3/uL (ref 0.7–4.0)
MCH: 29.6 pg (ref 26.0–34.0)
MCHC: 34.6 g/dL (ref 30.0–36.0)
MCV: 85.7 fL (ref 78.0–100.0)
MONOS PCT: 6 %
Monocytes Absolute: 0.5 10*3/uL (ref 0.1–1.0)
Neutro Abs: 3.7 10*3/uL (ref 1.7–7.7)
Neutrophils Relative %: 48 %
Platelets: 263 10*3/uL (ref 150–400)
RBC: 5.1 MIL/uL (ref 4.22–5.81)
RDW: 12.7 % (ref 11.5–15.5)
WBC: 7.7 10*3/uL (ref 4.0–10.5)

## 2016-04-08 LAB — URINALYSIS, MICROSCOPIC (REFLEX): Squamous Epithelial / LPF: NONE SEEN

## 2016-04-08 MED ORDER — HYDROMORPHONE HCL 1 MG/ML IJ SOLN
1.0000 mg | Freq: Once | INTRAMUSCULAR | Status: AC
  Administered 2016-04-08: 1 mg via INTRAVENOUS
  Filled 2016-04-08: qty 1

## 2016-04-08 MED ORDER — TAMSULOSIN HCL 0.4 MG PO CAPS: 0.4000 mg | ORAL_CAPSULE | Freq: Every day | ORAL | 0 refills | Status: DC

## 2016-04-08 MED ORDER — OXYCODONE-ACETAMINOPHEN 5-325 MG PO TABS: 1.0000 | ORAL_TABLET | Freq: Four times a day (QID) | ORAL | 0 refills | Status: DC | PRN

## 2016-04-08 MED ORDER — CEPHALEXIN 500 MG PO CAPS: 500.0000 mg | ORAL_CAPSULE | Freq: Four times a day (QID) | ORAL | 0 refills | Status: DC

## 2016-04-08 MED ORDER — KETOROLAC TROMETHAMINE 30 MG/ML IJ SOLN
30.0000 mg | Freq: Once | INTRAMUSCULAR | Status: AC
  Administered 2016-04-08: 30 mg via INTRAVENOUS
  Filled 2016-04-08: qty 1

## 2016-04-08 MED ORDER — HYDROMORPHONE HCL 1 MG/ML IJ SOLN
0.5000 mg | Freq: Once | INTRAMUSCULAR | Status: AC
  Administered 2016-04-08: 0.5 mg via INTRAVENOUS
  Filled 2016-04-08: qty 1

## 2016-04-08 MED ORDER — ONDANSETRON HCL 4 MG/2ML IJ SOLN
4.0000 mg | Freq: Once | INTRAMUSCULAR | Status: AC
  Administered 2016-04-08: 4 mg via INTRAVENOUS
  Filled 2016-04-08: qty 2

## 2016-04-08 MED ORDER — ONDANSETRON 4 MG PO TBDP: ORAL_TABLET | ORAL | 0 refills | Status: DC

## 2016-04-08 NOTE — Discharge Instructions (Signed)
Follow-up with [facility] next week return sooner if problems

## 2016-04-08 NOTE — ED Provider Notes (Signed)
[facility] DEPT Provider Note   CSN: [id number] Arrival date & time: 04/08/16  [id number]     History   Chief Complaint Chief Complaint  Patient presents with  . Flank Pain    HPI Kent Goodwin is a 35 y.o. male.  Patient complains of left flank pain. Patient has a history of kidney stones.   The history is provided by the patient. No language interpreter was used.  Flank Pain  This is a new problem. The current episode started 2 days ago. The problem occurs constantly. The problem has not changed since onset.Pertinent negatives include no chest pain, no abdominal pain and no headaches. Nothing aggravates the symptoms. Nothing relieves the symptoms. He has tried nothing for the symptoms.    Past Medical History:  Diagnosis Date  . Kidney stones     Patient Active Problem List   Diagnosis Date Noted  . NEPHROLITHIASIS 09/05/2008  . CHEST PAIN, ACUTE 08/29/2008  . SMOKELESS TOBACCO ABUSE 03/23/2008  . BACK PAIN, LUMBAR, CHRONIC 03/23/2008    Past Surgical History:  Procedure Laterality Date  . BACK SURGERY         Home Medications    Prior to Admission medications   Medication Sig Start Date End Date Taking? Authorizing Provider  ibuprofen (ADVIL,MOTRIN) 200 MG tablet Take 400-600 mg by mouth every 6 (six) hours as needed for moderate pain.   Yes Historical Provider, MD  acetaminophen (TYLENOL) 500 MG tablet Take 1,500 mg by mouth every 6 (six) hours as needed for moderate pain.    Historical Provider, MD  cephALEXin (KEFLEX) 500 MG capsule Take 1 capsule (500 mg total) by mouth 4 (four) times daily. 04/08/16   Kent BerkshireJoseph Ladon Heney, MD  naproxen (NAPROSYN) 250 MG tablet Take 1 tablet (250 mg total) by mouth 2 (two) times daily with a meal. Patient not taking: Reported on 04/08/2016 10/21/14   Kent JesterKathleen McManus, DO  ondansetron (ZOFRAN ODT) 4 MG disintegrating tablet 4mg  ODT q4 hours prn nausea/vomit 04/08/16   Kent BerkshireJoseph Cecia Egge, MD  ondansetron (ZOFRAN) 4 MG tablet Take 1  tablet (4 mg total) by mouth every 8 (eight) hours as needed for nausea or vomiting. Patient not taking: Reported on 04/08/2016 10/21/14   Kent JesterKathleen McManus, DO  oxyCODONE-acetaminophen (PERCOCET/ROXICET) 5-325 MG tablet Take 1 tablet by mouth every 6 (six) hours as needed. 04/08/16   Kent BerkshireJoseph Akin Yi, MD  promethazine (PHENERGAN) 25 MG tablet Take 1 tablet (25 mg total) by mouth every 6 (six) hours as needed for nausea or vomiting. Patient not taking: Reported on 10/21/2014 10/11/14   Kent Creasehristopher J Pollina, MD  tamsulosin (FLOMAX) 0.4 MG CAPS capsule Take 1 capsule (0.4 mg total) by mouth daily. 04/08/16   Kent BerkshireJoseph Ashana Tullo, MD    Family History No family history on file.  Social History Social History  Substance Use Topics  . Smoking status: Current Every Day Smoker    Packs/day: 0.25    Types: Cigarettes  . Smokeless tobacco: Never Used  . Alcohol use No     Allergies   Patient has no known allergies.   Review of Systems Review of Systems  Constitutional: Negative for appetite change and fatigue.  HENT: Negative for congestion, ear discharge and sinus pressure.   Eyes: Negative for discharge.  Respiratory: Negative for cough.   Cardiovascular: Negative for chest pain.  Gastrointestinal: Negative for abdominal pain and diarrhea.  Genitourinary: Positive for flank pain. Negative for frequency and hematuria.  Musculoskeletal: Negative for back pain.  Skin: Negative for rash.  Neurological: Negative for seizures and headaches.  Psychiatric/Behavioral: Negative for hallucinations.     Physical Exam Updated Vital Signs BP 127/92 (BP Location: Left Arm)   Pulse 72   Temp 97.8 F (36.6 C) (Oral)   Resp 18   Ht 6\' 3"  (1.905 m)   Wt 215 lb (97.5 kg)   SpO2 98%   BMI 26.87 kg/m   Physical Exam  Constitutional: He is oriented to person, place, and time. He appears well-developed.  HENT:  Head: Normocephalic.  Eyes: Conjunctivae and EOM are normal. No scleral icterus.  Neck: Neck  supple. No thyromegaly present.  Cardiovascular: Normal rate and regular rhythm.  Exam reveals no gallop and no friction rub.   No murmur heard. Pulmonary/Chest: No stridor. He has no wheezes. He has no rales. He exhibits no tenderness.  Abdominal: He exhibits no distension. There is no tenderness. There is no rebound.  Genitourinary:  Genitourinary Comments: Moderate left flank tender  Musculoskeletal: Normal range of motion. He exhibits no edema.  Lymphadenopathy:    He has no cervical adenopathy.  Neurological: He is oriented to person, place, and time. He exhibits normal muscle tone. Coordination normal.  Skin: No rash noted. No erythema.  Psychiatric: He has a normal mood and affect. His behavior is normal.     ED Treatments / Results  Labs (all labs ordered are listed, but only abnormal results are displayed) Labs Reviewed  URINALYSIS, ROUTINE W REFLEX MICROSCOPIC - Abnormal; Notable for the following:       Result Value   Color, Urine RED (*)    APPearance TURBID (*)    Hgb urine dipstick LARGE (*)    Ketones, ur TRACE (*)    Protein, ur 100 (*)    Leukocytes, UA TRACE (*)    All other components within normal limits  URINALYSIS, MICROSCOPIC (REFLEX) - Abnormal; Notable for the following:    Bacteria, UA MANY (*)    All other components within normal limits  COMPREHENSIVE METABOLIC PANEL - Abnormal; Notable for the following:    AST 49 (*)    All other components within normal limits  CBC WITH DIFFERENTIAL/PLATELET    EKG  EKG Interpretation None       Radiology [facility]  Result Date: 04/08/2016 CLINICAL DATA:  Left flank pain for 3 days.  Nephrolithiasis. EXAM: RENAL / URINARY TRACT ULTRASOUND COMPLETE COMPARISON:  CT on 10/21/2014 FINDINGS: Right Kidney: Length: 9.9 cm. Echogenicity within normal limits. No mass or hydronephrosis visualized. Left Kidney: Length: 12.3 cm. Echogenicity within normal limits. No masses identified. Several small less than 1 cm  echogenic foci are seen in the mid and lower poles of the left kidney, consistent with renal calculi. Mild left renal pelvicaliectasis is again noted and without significant change since previous study. Bladder: Appears normal for degree of bladder distention. Bilateral ureteral jets seen on color Doppler ultrasound. IMPRESSION: Mild left renal pelvicaliectasis, without significant change. Small less than 1 cm left intrarenal calculi. Electronically Signed   By: Myles RosenthalJohn  Stahl M.D.   On: 04/08/2016 12:55    Procedures Procedures (including critical care time)  Medications Ordered in ED Medications  ketorolac (TORADOL) 30 MG/ML injection 30 mg (30 mg Intravenous Given 04/08/16 1217)  ondansetron (ZOFRAN) injection 4 mg (4 mg Intravenous Given 04/08/16 1217)  HYDROmorphone (DILAUDID) injection 1 mg (1 mg Intravenous Given 04/08/16 1305)  HYDROmorphone (DILAUDID) injection 0.5 mg (0.5 mg Intravenous Given 04/08/16 1427)  ondansetron (ZOFRAN) injection 4 mg (4 mg Intravenous Given  04/08/16 1427)     Initial Impression / Assessment and Plan / ED Course  I have reviewed the triage vital signs and the nursing notes.  Pertinent labs & imaging results that were available during my care of the patient were reviewed by me and considered in my medical decision making (see chart for details).  Clinical Course     Patient with hematuria and flank pain. Ultrasound shows small stones in his kidney. Patient is sent home on Keflex Flomax Zofran and Percocet and will follow-up with urology  Final Clinical Impressions(s) / ED Diagnoses   Final diagnoses:  Nephrolithiasis    New Prescriptions New Prescriptions   CEPHALEXIN (KEFLEX) 500 MG CAPSULE    Take 1 capsule (500 mg total) by mouth 4 (four) times daily.   ONDANSETRON (ZOFRAN ODT) 4 MG DISINTEGRATING TABLET    4mg  ODT q4 hours prn nausea/vomit   OXYCODONE-ACETAMINOPHEN (PERCOCET/ROXICET) 5-325 MG TABLET    Take 1 tablet by mouth every 6 (six) hours as  needed.   TAMSULOSIN (FLOMAX) 0.4 MG CAPS CAPSULE    Take 1 capsule (0.4 mg total) by mouth daily.     Kent Berkshire, MD 04/08/16 [phone]

## 2016-04-08 NOTE — ED Triage Notes (Signed)
Pt reports left flank pain x 3 weeks. Pt reports he is also having the same pain in suprapubic region. Reports hematuria and nausea. Denies vomiting, fever.

## 2016-04-10 LAB — URINE CULTURE: CULTURE: NO GROWTH

## 2016-05-06 ENCOUNTER — Other Ambulatory Visit: Payer: Self-pay | Admitting: Urology

## 2016-05-16 ENCOUNTER — Encounter (HOSPITAL_BASED_OUTPATIENT_CLINIC_OR_DEPARTMENT_OTHER): Payer: Self-pay | Admitting: *Deleted

## 2016-05-16 NOTE — Progress Notes (Signed)
NPO AFTER MN.  ARRIVE AT 0700.  NEEDS HG.  MAY TAKE PAIN/ NAUSEA RX IF NEEDED AM DOS W/ SIPS OF WATER AND TYLENOL.

## 2016-05-21 ENCOUNTER — Encounter (HOSPITAL_BASED_OUTPATIENT_CLINIC_OR_DEPARTMENT_OTHER): Payer: Self-pay | Admitting: *Deleted

## 2016-05-21 ENCOUNTER — Ambulatory Visit (HOSPITAL_BASED_OUTPATIENT_CLINIC_OR_DEPARTMENT_OTHER)
Admission: RE | Admit: 2016-05-21 | Discharge: 2016-05-21 | Disposition: A | Discharge status: Home / Self Care | Payer: BLUE CROSS/BLUE SHIELD | Source: Ambulatory Visit | Attending: Urology | Admitting: Urology

## 2016-05-21 ENCOUNTER — Ambulatory Visit (HOSPITAL_BASED_OUTPATIENT_CLINIC_OR_DEPARTMENT_OTHER): Payer: BLUE CROSS/BLUE SHIELD | Admitting: Anesthesiology

## 2016-05-21 ENCOUNTER — Encounter (HOSPITAL_BASED_OUTPATIENT_CLINIC_OR_DEPARTMENT_OTHER)
Admission: RE | Disposition: A | Discharge status: Home / Self Care | Payer: Self-pay | Source: Ambulatory Visit | Attending: Urology

## 2016-05-21 DIAGNOSIS — N132 Hydronephrosis with renal and ureteral calculous obstruction: Secondary | ICD-10-CM | POA: Insufficient documentation

## 2016-05-21 DIAGNOSIS — Z87891 Personal history of nicotine dependence: Secondary | ICD-10-CM | POA: Insufficient documentation

## 2016-05-21 DIAGNOSIS — N202 Calculus of kidney with calculus of ureter: Secondary | ICD-10-CM | POA: Diagnosis present

## 2016-05-21 HISTORY — DX: Gross hematuria: R31.0

## 2016-05-21 HISTORY — DX: Calculus of kidney: N20.0

## 2016-05-21 HISTORY — DX: Adverse effect of unspecified anesthetic, initial encounter: T41.45XA

## 2016-05-21 HISTORY — DX: Calculus of ureter: N20.1

## 2016-05-21 HISTORY — PX: CYSTOSCOPY/URETEROSCOPY/HOLMIUM LASER/STENT PLACEMENT: SHX6546

## 2016-05-21 HISTORY — DX: Other complications of anesthesia, initial encounter: T88.59XA

## 2016-05-21 HISTORY — DX: Personal history of urinary calculi: Z87.442

## 2016-05-21 HISTORY — DX: Urgency of urination: R39.15

## 2016-05-21 LAB — POCT HEMOGLOBIN-HEMACUE: HEMOGLOBIN: 14.7 g/dL (ref 13.0–17.0)

## 2016-05-21 SURGERY — CYSTOSCOPY/URETEROSCOPY/HOLMIUM LASER/STENT PLACEMENT
Anesthesia: General | Site: Renal | Laterality: Left

## 2016-05-21 MED ORDER — PROPOFOL 10 MG/ML IV BOLUS
INTRAVENOUS | Status: DC | PRN
  Administered 2016-05-21: 270 mg via INTRAVENOUS
  Administered 2016-05-21: 30 mg via INTRAVENOUS

## 2016-05-21 MED ORDER — ACETAMINOPHEN 10 MG/ML IV SOLN
INTRAVENOUS | Status: DC | PRN
  Administered 2016-05-21: 1000 mg via INTRAVENOUS

## 2016-05-21 MED ORDER — LIDOCAINE 2% (20 MG/ML) 5 ML SYRINGE
INTRAMUSCULAR | Status: AC
  Filled 2016-05-21: qty 5

## 2016-05-21 MED ORDER — KETOROLAC TROMETHAMINE 30 MG/ML IJ SOLN
INTRAMUSCULAR | Status: AC
  Filled 2016-05-21: qty 1

## 2016-05-21 MED ORDER — LACTATED RINGERS IV SOLN
INTRAVENOUS | Status: DC
  Administered 2016-05-21 (×2): via INTRAVENOUS
  Filled 2016-05-21: qty 1000

## 2016-05-21 MED ORDER — CEFAZOLIN IN D5W 1 GM/50ML IV SOLN
1.0000 g | INTRAVENOUS | Status: DC
  Filled 2016-05-21: qty 50

## 2016-05-21 MED ORDER — DEXAMETHASONE SODIUM PHOSPHATE 4 MG/ML IJ SOLN
INTRAMUSCULAR | Status: DC | PRN
  Administered 2016-05-21: 10 mg via INTRAVENOUS

## 2016-05-21 MED ORDER — OXYCODONE HCL 5 MG PO TABS
ORAL_TABLET | ORAL | Status: AC
  Filled 2016-05-21: qty 1

## 2016-05-21 MED ORDER — ACETAMINOPHEN 10 MG/ML IV SOLN
INTRAVENOUS | Status: AC
  Filled 2016-05-21: qty 100

## 2016-05-21 MED ORDER — IOHEXOL 300 MG/ML  SOLN
INTRAMUSCULAR | Status: DC | PRN
  Administered 2016-05-21: 10 mL

## 2016-05-21 MED ORDER — LIDOCAINE 2% (20 MG/ML) 5 ML SYRINGE
INTRAMUSCULAR | Status: DC | PRN
  Administered 2016-05-21: 100 mg via INTRAVENOUS

## 2016-05-21 MED ORDER — HYDROMORPHONE HCL 1 MG/ML IJ SOLN
0.2500 mg | INTRAMUSCULAR | Status: DC | PRN
  Filled 2016-05-21: qty 0.5

## 2016-05-21 MED ORDER — CEFAZOLIN SODIUM-DEXTROSE 2-4 GM/100ML-% IV SOLN
2.0000 g | INTRAVENOUS | Status: AC
  Administered 2016-05-21: 2 g via INTRAVENOUS
  Filled 2016-05-21: qty 100

## 2016-05-21 MED ORDER — SENNOSIDES-DOCUSATE SODIUM 8.6-50 MG PO TABS: 1.0000 | ORAL_TABLET | Freq: Two times a day (BID) | ORAL | 0 refills | Status: DC

## 2016-05-21 MED ORDER — FENTANYL CITRATE (PF) 100 MCG/2ML IJ SOLN
INTRAMUSCULAR | Status: DC | PRN
  Administered 2016-05-21 (×2): 100 ug via INTRAVENOUS

## 2016-05-21 MED ORDER — PROPOFOL 500 MG/50ML IV EMUL
INTRAVENOUS | Status: AC
  Filled 2016-05-21: qty 50

## 2016-05-21 MED ORDER — ONDANSETRON HCL 4 MG/2ML IJ SOLN
INTRAMUSCULAR | Status: AC
  Filled 2016-05-21: qty 2

## 2016-05-21 MED ORDER — EPHEDRINE 5 MG/ML INJ
INTRAVENOUS | Status: AC
  Filled 2016-05-21: qty 10

## 2016-05-21 MED ORDER — WHITE PETROLATUM GEL
Status: AC
  Filled 2016-05-21: qty 5

## 2016-05-21 MED ORDER — CEPHALEXIN 500 MG PO CAPS
500.0000 mg | ORAL_CAPSULE | Freq: Two times a day (BID) | ORAL | 0 refills | Status: DC
Start: 2016-05-21 — End: 2017-08-08

## 2016-05-21 MED ORDER — SODIUM CHLORIDE 0.9 % IR SOLN
Status: DC | PRN
  Administered 2016-05-21: 4000 mL

## 2016-05-21 MED ORDER — OXYCODONE HCL 5 MG PO TABS
5.0000 mg | ORAL_TABLET | Freq: Once | ORAL | Status: AC
  Administered 2016-05-21: 5 mg via ORAL
  Filled 2016-05-21: qty 1

## 2016-05-21 MED ORDER — MIDAZOLAM HCL 2 MG/2ML IJ SOLN
INTRAMUSCULAR | Status: AC
  Filled 2016-05-21: qty 2

## 2016-05-21 MED ORDER — OXYCODONE-ACETAMINOPHEN 5-325 MG PO TABS: 1.0000 | ORAL_TABLET | Freq: Four times a day (QID) | ORAL | 0 refills | Status: DC | PRN

## 2016-05-21 MED ORDER — KETOROLAC TROMETHAMINE 30 MG/ML IJ SOLN
INTRAMUSCULAR | Status: DC | PRN
  Administered 2016-05-21: 30 mg via INTRAVENOUS

## 2016-05-21 MED ORDER — PHENYLEPHRINE 40 MCG/ML (10ML) SYRINGE FOR IV PUSH (FOR BLOOD PRESSURE SUPPORT)
PREFILLED_SYRINGE | INTRAVENOUS | Status: AC
  Filled 2016-05-21: qty 10

## 2016-05-21 MED ORDER — CEFAZOLIN SODIUM-DEXTROSE 2-4 GM/100ML-% IV SOLN
INTRAVENOUS | Status: AC
  Filled 2016-05-21: qty 100

## 2016-05-21 MED ORDER — PROMETHAZINE HCL 25 MG/ML IJ SOLN
6.2500 mg | INTRAMUSCULAR | Status: DC | PRN
  Filled 2016-05-21: qty 1

## 2016-05-21 MED ORDER — FENTANYL CITRATE (PF) 100 MCG/2ML IJ SOLN
INTRAMUSCULAR | Status: AC
  Filled 2016-05-21: qty 2

## 2016-05-21 MED ORDER — ONDANSETRON HCL 4 MG/2ML IJ SOLN
INTRAMUSCULAR | Status: DC | PRN
  Administered 2016-05-21: 4 mg via INTRAVENOUS

## 2016-05-21 MED ORDER — MIDAZOLAM HCL 5 MG/5ML IJ SOLN
INTRAMUSCULAR | Status: DC | PRN
  Administered 2016-05-21: 2 mg via INTRAVENOUS

## 2016-05-21 MED ORDER — ARTIFICIAL TEARS OP OINT
TOPICAL_OINTMENT | OPHTHALMIC | Status: AC
  Filled 2016-05-21: qty 3.5

## 2016-05-21 MED ORDER — DEXAMETHASONE SODIUM PHOSPHATE 10 MG/ML IJ SOLN
INTRAMUSCULAR | Status: AC
  Filled 2016-05-21: qty 1

## 2016-05-21 MED ORDER — KETOROLAC TROMETHAMINE 10 MG PO TABS: 10.0000 mg | ORAL_TABLET | Freq: Four times a day (QID) | ORAL | 1 refills | Status: DC | PRN

## 2016-05-21 SURGICAL SUPPLY — 30 items
BAG DRAIN URO-CYSTO SKYTR STRL (DRAIN) ×3 IMPLANT
BAG DRN UROCATH (DRAIN) ×1
BASKET DAKOTA 1.9FR 11X120 (BASKET) IMPLANT
BASKET LASER NITINOL 1.9FR (BASKET) ×2 IMPLANT
BASKET ZERO TIP NITINOL 2.4FR (BASKET) IMPLANT
BSKT STON RTRVL 120 1.9FR (BASKET) ×1
BSKT STON RTRVL ZERO TP 2.4FR (BASKET)
CATH INTERMIT  6FR 70CM (CATHETERS) ×3 IMPLANT
CLOTH BEACON ORANGE TIMEOUT ST (SAFETY) ×3 IMPLANT
FIBER LASER FLEXIVA 365 (UROLOGICAL SUPPLIES) IMPLANT
FIBER LASER TRAC TIP (UROLOGICAL SUPPLIES) ×2 IMPLANT
GLOVE BIO SURGEON STRL SZ7.5 (GLOVE) ×5 IMPLANT
GLOVE BIOGEL PI IND STRL 7.5 (GLOVE) IMPLANT
GLOVE BIOGEL PI INDICATOR 7.5 (GLOVE) ×2
GOWN STRL REUS W/ TWL LRG LVL3 (GOWN DISPOSABLE) IMPLANT
GOWN STRL REUS W/TWL LRG LVL3 (GOWN DISPOSABLE) ×4 IMPLANT
GUIDEWIRE ANG ZIPWIRE 038X150 (WIRE) ×3 IMPLANT
GUIDEWIRE STR DUAL SENSOR (WIRE) ×3 IMPLANT
IV NS 1000ML (IV SOLUTION) ×3
IV NS 1000ML BAXH (IV SOLUTION) ×1 IMPLANT
IV NS IRRIG 3000ML ARTHROMATIC (IV SOLUTION) ×3 IMPLANT
KIT ROOM TURNOVER WOR (KITS) ×3 IMPLANT
MANIFOLD NEPTUNE II (INSTRUMENTS) ×3 IMPLANT
NS IRRIG 500ML POUR BTL (IV SOLUTION) ×3 IMPLANT
PACK CYSTO (CUSTOM PROCEDURE TRAY) ×3 IMPLANT
STENT POLARIS 5FRX26 (STENTS) ×2 IMPLANT
SYRINGE 10CC LL (SYRINGE) ×3 IMPLANT
TUBE CONNECTING 12'X1/4 (SUCTIONS)
TUBE CONNECTING 12X1/4 (SUCTIONS) IMPLANT
TUBE FEEDING 8FR 16IN STR KANG (MISCELLANEOUS) ×3 IMPLANT

## 2016-05-21 NOTE — Transfer of Care (Signed)
Immediate Anesthesia Transfer of Care Note  Patient: Kent Goodwin  Procedure(s) Performed: Procedure(s): CYSTOSCOPY/ RETROGRADE PYELOGRAM/ URETEROSCOPY/HOLMIUM LASER/STENT PLACEMENT (Left)  Patient Location: PACU  Anesthesia Type:General  Level of Consciousness: awake, alert , oriented and patient cooperative  Airway & Oxygen Therapy: Patient Spontanous Breathing and Patient connected to nasal cannula oxygen  Post-op Assessment: Report given to RN and Post -op Vital signs reviewed and stable  Post vital signs: Reviewed and stable  Last Vitals:  Vitals:   05/21/16 0657  BP: (!) 135/91  Pulse: 80  Resp: 16  Temp: 36.9 C    Last Pain:  Vitals:   05/21/16 0712  TempSrc:   PainSc: 4       Patients Stated Pain Goal: 8 (05/21/16 [id number])  Complications: No apparent anesthesia complications

## 2016-05-21 NOTE — Anesthesia Postprocedure Evaluation (Addendum)
Anesthesia Post Note  Patient: Kent Goodwin  Procedure(s) Performed: Procedure(s) (LRB): CYSTOSCOPY/ RETROGRADE PYELOGRAM/ URETEROSCOPY/HOLMIUM LASER/STENT PLACEMENT (Left)  Patient location during evaluation: PACU Anesthesia Type: General Level of consciousness: awake and alert Pain management: pain level controlled (still alittle uncomfortable) Vital Signs Assessment: post-procedure vital signs reviewed and stable Respiratory status: spontaneous breathing, nonlabored ventilation, respiratory function stable and patient connected to nasal cannula oxygen Cardiovascular status: blood pressure returned to baseline and stable Postop Assessment: no signs of nausea or vomiting Anesthetic complications: no       Last Vitals:  Vitals:   05/21/16 0945 05/21/16 1000  BP: (!) 123/94 (!) 126/96  Pulse: 71 69  Resp: 11 (!) 22  Temp:      Last Pain:  Vitals:   05/21/16 1016  TempSrc:   PainSc: 8                  Leonora Gores,JAMES TERRILL

## 2016-05-21 NOTE — Discharge Instructions (Signed)
1 - You may have urinary urgency (bladder spasms) and bloody urine on / off with stent in place. This is normal. ° °2 - Remove tethered stent on Friday morning by pulling on string, then blue-white plastic tubing, and discarding. Office is open Friday if any issues arise.  ° °3 - Call MD or go to ER for fever >102, severe pain / nausea / vomiting not relieved by medications, or acute change in medical status ° ° °Post Anesthesia Home Care Instructions ° °Activity: °Get plenty of rest for the remainder of the day. A responsible adult should stay with you for 24 hours following the procedure.  °For the next 24 hours, DO NOT: °-Drive a car °-Operate machinery °-Drink alcoholic beverages °-Take any medication unless instructed by your physician °-Make any legal decisions or sign important papers. ° °Meals: °Start with liquid foods such as gelatin or soup. Progress to regular foods as tolerated. Avoid greasy, spicy, heavy foods. If nausea and/or vomiting occur, drink only clear liquids until the nausea and/or vomiting subsides. Call your physician if vomiting continues. ° °Special Instructions/Symptoms: °Your throat may feel dry or sore from the anesthesia or the breathing tube placed in your throat during surgery. If this causes discomfort, gargle with warm salt water. The discomfort should disappear within 24 hours. ° °If you had a scopolamine patch placed behind your ear for the management of post- operative nausea and/or vomiting: ° °1. The medication in the patch is effective for 72 hours, after which it should be removed.  Wrap patch in a tissue and discard in the trash. Wash hands thoroughly with soap and water. °2. You may remove the patch earlier than 72 hours if you experience unpleasant side effects which may include dry mouth, dizziness or visual disturbances. °3. Avoid touching the patch. Wash your hands with soap and water after contact with the patch. °  °[facility] °[phone] °Post  Ureteroscopy With or Without Stent Instructions ° °Definitions: ° °Ureter: The duct that transports urine from the kidney to the bladder. °Stent:   A plastic hollow tube that is placed into the ureter, from the kidney to the                 bladder to prevent the ureter from swelling shut. ° °GENERAL INSTRUCTIONS: ° °Despite the fact that no skin incisions were used, the area around the ureter and bladder is raw and irritated. The stent is a foreign body which will further irritate the bladder wall. This irritation is manifested by increased frequency of urination, both day and night, and by an increase in the urge to urinate. In some, the urge to urinate is present almost always. Sometimes the urge is strong enough that you may not be able to stop yourself from urinating. The only real cure is to remove the stent and then give time for the bladder wall to heal which can't be done until the danger of the ureter swelling shut has passed, which varies. ° °You may see some blood in your urine while the stent is in place and a few days afterwards. Do not be alarmed, even if the urine was clear for a while. Get off your feet and drink lots of fluids until clearing occurs. If you start to pass clots or don't improve, call us. ° °DIET: °You may return to your normal diet immediately. Because of the raw surface of your bladder, alcohol, spicy foods, acid type foods and drinks with caffeine may cause   irritation or frequency and should be used in moderation. To keep your urine flowing freely and to avoid constipation, drink plenty of fluids during the day ( 8-10 glasses ). °Tip: Avoid cranberry juice because it is very acidic. ° °ACTIVITY: °Your physical activity doesn't need to be restricted. However, if you are very active, you may see some blood in your urine. We suggest that you reduce your activity under these circumstances until the bleeding has stopped. ° °BOWELS: °It is important to keep your bowels regular during the  postoperative period. Straining with bowel movements can cause bleeding. A bowel movement every other day is reasonable. Use a mild laxative if needed, such as Milk of Magnesia 2-3 tablespoons, or 2 Dulcolax tablets. Call if you continue to have problems. If you have been taking narcotics for pain, before, during or after your surgery, you may be constipated. Take a laxative if necessary. ° ° °MEDICATION: °You should resume your pre-surgery medications unless told not to. In addition you will often be given an antibiotic to prevent infection. These should be taken as prescribed until the bottles are finished unless you are having an unusual reaction to one of the drugs. ° °PROBLEMS YOU SHOULD REPORT TO US: °· Fevers over 100.5 Fahrenheit. °· Heavy bleeding, or clots ( See above notes about blood in urine ). °· Inability to urinate. °· Drug reactions ( hives, rash, nausea, vomiting, diarrhea ). °· Severe burning or pain with urination that is not improving. ° °FOLLOW-UP: °You will need a follow-up appointment to monitor your progress. Call for this appointment at the number listed above. Usually the first appointment will be about three to fourteen days after your surgery. ° ° ° ° ° ° °

## 2016-05-21 NOTE — Anesthesia Preprocedure Evaluation (Addendum)
Anesthesia Evaluation  Patient identified by MRN, date of birth, ID band Patient awake    History of Anesthesia Complications (+) PROLONGED EMERGENCE and history of anesthetic complications  Airway Mallampati: I  TM Distance: >3 FB Neck ROM: Full    Dental  (+) Teeth Intact, Dental Advisory Given   Pulmonary former smoker,    breath sounds clear to auscultation       Cardiovascular Exercise Tolerance: Good negative cardio ROS   Rhythm:Regular Rate:Normal     Neuro/Psych Anxiety Chronic pain pain s/p lumbar fusion    GI/Hepatic negative GI ROS, Neg liver ROS,   Endo/Other  negative endocrine ROS  Renal/GU Renal diseaseRenal stones     Musculoskeletal   Abdominal   Peds  Hematology negative hematology ROS (+)   Anesthesia Other Findings   Reproductive/Obstetrics                           Anesthesia Physical Anesthesia Plan  ASA: II  Anesthesia Plan: General   Post-op Pain Management:    Induction: Intravenous  Airway Management Planned: LMA  Additional Equipment:   Intra-op Plan:   Post-operative Plan: Extubation in OR  Informed Consent: I have reviewed the patients History and Physical, chart, labs and discussed the procedure including the risks, benefits and alternatives for the proposed anesthesia with the patient or authorized representative who has indicated his/her understanding and acceptance.   Dental advisory given  Plan Discussed with: CRNA  Anesthesia Plan Comments:         Anesthesia Quick Evaluation

## 2016-05-21 NOTE — H&P (Signed)
Kent Goodwin is an 36 y.o. male.    Chief Complaint: Pre-op LEFT Ureteroscopic Stone manipulation  HPI:   1 - Recurrent Nephrolithiasis - Left 5mm mid ureteral stone + 5mm intrarenal stone x 2 by CT on eval left flank pain. UA without infectious parameters. No interval passage with few weeks of medical therapy.  Today "[name]" is seen to proceed with LEFT ureteroscopic stone manipulation with goal of left side stone free. No interval fevers.   Past Medical History:  Diagnosis Date  . Complication of anesthesia    SLOW TO WAKE  . Gross hematuria   . History of kidney stones   . Left ureteral stone   . Renal calculus, left   . Urgency of urination     Past Surgical History:  Procedure Laterality Date  . CYSTO/  LEFT RETROGRADE PYELOGRAM/  LEFT URETERAL STENT PLACEMENT  01/14/2011  . CYSTO/  URETHRAL DILATION/  RIGHT URETEROSCOPIC STONE EXTRACTION  02/16/2006  . LUMBAR FUSION  2006   L5 -- S1    History reviewed. No pertinent family history. Social History:  reports that he quit smoking about 2 years ago. His smoking use included Cigarettes. He has a 3.25 pack-year smoking history. His smokeless tobacco use includes Snuff. He reports that he does not drink alcohol or use drugs.  Allergies: No Known Allergies  Medications Prior to Admission  Medication Sig Dispense Refill  . ondansetron (ZOFRAN ODT) 4 MG disintegrating tablet 4mg  ODT q4 hours prn nausea/vomit 12 tablet 0  . tamsulosin (FLOMAX) 0.4 MG CAPS capsule Take 1 capsule (0.4 mg total) by mouth daily. (Patient taking differently: Take 0.4 mg by mouth daily after supper. ) 6 capsule 0  . acetaminophen (TYLENOL) 500 MG tablet Take 1,500 mg by mouth every 6 (six) hours as needed for moderate pain.    Marland Kitchen ibuprofen (ADVIL,MOTRIN) 200 MG tablet Take 400-600 mg by mouth every 6 (six) hours as needed for moderate pain.    . naproxen (NAPROSYN) 250 MG tablet Take by mouth as needed.    Marland Kitchen oxyCODONE-acetaminophen (PERCOCET/ROXICET)  5-325 MG tablet Take 1 tablet by mouth every 6 (six) hours as needed. 20 tablet 0    No results found for this or any previous visit (from the past 48 hour(s)). No results found.  Review of Systems  Constitutional: Negative.  Negative for chills and fever.  HENT: Negative.   Eyes: Negative.   Respiratory: Negative.   Cardiovascular: Negative.   Gastrointestinal: Negative.   Genitourinary: Positive for flank pain.  Skin: Negative.   Neurological: Negative.   Endo/Heme/Allergies: Negative.     Blood pressure (!) 135/91, pulse 80, temperature 98.4 F (36.9 C), temperature source Oral, resp. rate 16, height 6\' 3"  (1.905 m), weight 96.2 kg (212 lb), SpO2 100 %. Physical Exam  Constitutional: He is oriented to person, place, and time. He appears well-developed.  HENT:  Head: Normocephalic.  Eyes: Pupils are equal, round, and reactive to light.  Neck: Normal range of motion.  Cardiovascular: Normal rate.   Respiratory: Effort normal.  GI: Soft.  Genitourinary:  Genitourinary Comments: Mild left CVAT  Musculoskeletal: Normal range of motion.  Neurological: He is alert and oriented to person, place, and time.  Skin: Skin is warm.  Psychiatric: He has a normal mood and affect. His behavior is normal. Judgment and thought content normal.     Assessment/Plan  1 - Recurrent Nephrolithiasis - proceed as planned with LEFT ureteroscopic stone manipulation. Risks, benefits, alternative, expected peri-op course,  need for possible temporary stents or even staged approach discussed previously and reiterated today.   Sebastian AcheMANNY, Deral Schellenberg, MD 05/21/2016, 7:15 AM

## 2016-05-21 NOTE — Brief Op Note (Signed)
05/21/2016  9:14 AM  PATIENT:  Nicola GirtWesley J Lovering  36 y.o. male  PRE-OPERATIVE DIAGNOSIS:  LEFT RENAL , LEFT URETERAL STONE  POST-OPERATIVE DIAGNOSIS:  LEFT RENAL , LEFT URETERAL STONE  PROCEDURE:  Procedure(s): CYSTOSCOPY/ RETROGRADE PYELOGRAM/ URETEROSCOPY/HOLMIUM LASER/STENT PLACEMENT (Left)  SURGEON:  Surgeon(s) and Role:    * Sebastian Acheheodore Esraa Seres, MD - Primary  PHYSICIAN ASSISTANT:   ASSISTANTS: none   ANESTHESIA:   general  EBL:  Total I/O In: 1000 [I.V.:1000] Out: -   BLOOD ADMINISTERED:none  DRAINS: none   LOCAL MEDICATIONS USED:  NONE  SPECIMEN:  Source of Specimen:  left ureteral / renal stones  DISPOSITION OF SPECIMEN:  given to pt  COUNTS:  YES  TOURNIQUET:  * No tourniquets in log *  DICTATION: .Other Dictation: Dictation Number [id number]  PLAN OF CARE: Discharge to home after PACU  PATIENT DISPOSITION:  PACU - hemodynamically stable.   Delay start of Pharmacological VTE agent (>24hrs) due to surgical blood loss or risk of bleeding: yes

## 2016-05-21 NOTE — Anesthesia Procedure Notes (Signed)
Procedure Name: LMA Insertion Date/Time: 05/21/2016 8:32 AM Performed by: Tyrone NineSAUVE, Jonet Mathies F Pre-anesthesia Checklist: Patient identified, Timeout performed, Emergency Drugs available, Suction available and Patient being monitored Patient Re-evaluated:Patient Re-evaluated prior to inductionOxygen Delivery Method: Circle system utilized Preoxygenation: Pre-oxygenation with 100% oxygen Intubation Type: IV induction Ventilation: Mask ventilation without difficulty LMA: LMA inserted LMA Size: 5.0 Number of attempts: 1 Airway Equipment and Method: Bite block Placement Confirmation: positive ETCO2 and breath sounds checked- equal and bilateral Tube secured with: Tape Dental Injury: Teeth and Oropharynx as per pre-operative assessment

## 2016-05-22 ENCOUNTER — Encounter (HOSPITAL_BASED_OUTPATIENT_CLINIC_OR_DEPARTMENT_OTHER): Payer: Self-pay | Admitting: Urology

## 2016-05-22 NOTE — Op Note (Signed)
NAMEKILLIAN, SCHWER NO.:  [id number]  MEDICAL RECORD NO.:  [id number]  LOCATION:                                 FACILITY:  PHYSICIAN:  Sebastian Ache, MD     DATE OF BIRTH:  12-13-1980  DATE OF PROCEDURE: 05/21/2016                              OPERATIVE REPORT   DIAGNOSIS:  Multifocal left renal and ureteral stone with refractory colic.  PROCEDURES: 1. Cystoscopy with left retrograde pyelogram and interpretation. 2. Left ureteroscopy with laser lithotripsy. 3. Insertion of left ureteral stent, 5 x 26 Polaris with tether.  ESTIMATED BLOOD LOSS:  Nil.  COMPLICATION:  None.  SPECIMENS:  Left ureteral and renal stone fragments given to the patient.  FINDINGS: 1. Left impacted ureterovesical junction stone with mild proximal     hydroureteronephrosis. 2. Multifocal left intrarenal stones papillary tip in upper and     midpole, amenable to basketing, larger 5 mm proximal lower pole     stone, requiring laser lithotripsy and extraction. 3. Successful placement of left ureteral stent, proximal end in the     renal pelvis and distal end in the urinary bladder.  INDICATION:  Mr. Frangos is a very pleasant 36 year old gentleman with history of recurrent nephrolithiasis.  He has been several years without recurrence, but was found on workup of colicky flank pain to have recurrent stones multifocal left ureteral stone and multifocal left renal stones.  Options were discussed for management including continued medical therapy versus shockwave lithotripsy versus ureteroscopy with goal of its lateral stone free and he wished to proceed with the latter. Informed consent was obtained and placed in the medical record.  PROCEDURE IN DETAIL:  The patient being, Jeovanni Heuring, was verified. Procedure being left ureteroscopic stone manipulation was confirmed. Procedure was carried out.  Time-out was performed.  Intravenous antibiotics were administered.  General  anesthesia introduced.  The patient was placed into a low lithotomy position and sterile field was created by prepping and draping the patient's penis, perineum and proximal thighs using iodine.  Next, cystourethroscopy was performed using a rigid cystoscope with offset lens.  Inspection of the anterior and posterior urethra unremarkable.  Inspection of the urinary bladder revealed no diverticula, calcifications, papillary lesions.  There was some erythema and edema at the area of the left ureteral orifice, suspicious for likely impacted stone at this location.  The left ureteral orifice was very carefully cannulated with a 6-French end-hole catheter and left retrograde pyelogram was obtained.  Left retrograde pyelogram demonstrated a single left ureter with single- system left kidney.  There was a filling defect in the distal ureter consistent with suspected stone at the area of the ureterovesical junction.  There was mild hydroureteronephrosis above this.  Several attempts were made to place a 0.038 Zip wire as a safety wire; however, it just coiled off the distal stone.  It was felt that the safest way to proceed would be to fragment the stone on direct vision and placed a safety wire later.  As such, an 8-French feeding tube was placed in the urinary bladder for pressure release.  Semi-rigid ureteroscopy was performed of the distal ureter.  Indeed, a left ureteral  stone was encountered, impacted at the ureterovesical junction.  It did appear too large for simple basketing.  As such, holmium laser energy applied to the stone using settings of 0.2 joules and 20 Hz, and the stone was fragmented in approximately 3-4 smaller pieces.  As the fragments were generated, a clear window was seen intraluminally next to the stone and a 0.038 Zip wire was advanced as a safety wire during these maneuvers. After safety wire was secured, additional fragmentation was performed, an Escape basket was  used to reposition these small fragments into the level of the bladder neck.  Next, semi-rigid ureteroscopy was continued of the distal four-fifths of the left ureter alongside a separate Sensor working wire.  No mucosal abnormalities or additional calcifications were seen.  As the goal today was ipsilateral stone free, the semi-rigid ureteroscope was exchanged for 12/14, 38-cm ureteral access sheath at the level of proximal ureter using continuous fluoroscopic guidance and flexible digital ureteroscopy was performed of the proximal left ureter and systematic inspection of the left kidney including all calices x3. There were multifocal small-volume papillary tip calcifications noted in the upper and mid poles, these were amenable to simple basketing and removed.  There was a larger dominant calcification in the lower pole calyx that did not appear to be amenable to simple basketing.  Given the acute angulation of this, this was grasped with the Escape basket, repositioned into an upper pole calyx for more favorable angulation and holmium laser energy applied to the stone, again using settings of 0.2 joules and 20 Hz and was fragmented in approximately 4 smaller pieces, which were then sequentially grasped with the basket and removed, and set aside for discard.  Following these maneuvers, resulted in excellent hemostasis, complete resolution of all stone fragments larger than 1/3rd mm.  The access sheath was removed under continuous vision.  No mucosal abnormalities were found.  Given the multifocal nature of the stone and use of access sheath, it was felt that brief interval stenting would be warranted.  As such, a new 5 x 26 Polaris-type stent was placed over the remaining safety wire using fluoroscopic guidance.  Good proximal and distal deployment were noted.  The tether was left in place, fashioned to the dorsum of the penis and the procedure was terminated.  The patient tolerated the  procedure well.  There were no immediate periprocedural complications.  The patient was taken to the postanesthesia care unit in stable condition.    ______________________________ Sebastian Acheheodore Abhishek Levesque, MD   ______________________________ Sebastian Acheheodore Trenese Haft, MD    TM/MEDQ  D:  05/21/2016  T:  05/22/2016  Job:  [id number]

## 2016-10-03 NOTE — Addendum Note (Signed)
Addendum  created 10/03/16 0934 by Ashanta Amoroso, MD   Sign clinical note    

## 2017-07-19 ENCOUNTER — Encounter (HOSPITAL_COMMUNITY): Payer: Self-pay | Admitting: Emergency Medicine

## 2017-07-19 ENCOUNTER — Other Ambulatory Visit: Payer: Self-pay

## 2017-07-19 ENCOUNTER — Emergency Department (HOSPITAL_COMMUNITY): Payer: Commercial Managed Care - PPO

## 2017-07-19 ENCOUNTER — Emergency Department (HOSPITAL_COMMUNITY)
Admission: EM | Admit: 2017-07-19 | Discharge: 2017-07-20 | Disposition: A | Discharge status: Home / Self Care | Payer: Commercial Managed Care - PPO | Attending: Emergency Medicine | Admitting: Emergency Medicine

## 2017-07-19 DIAGNOSIS — R1084 Generalized abdominal pain: Secondary | ICD-10-CM | POA: Diagnosis present

## 2017-07-19 DIAGNOSIS — N201 Calculus of ureter: Secondary | ICD-10-CM | POA: Diagnosis not present

## 2017-07-19 DIAGNOSIS — N200 Calculus of kidney: Secondary | ICD-10-CM

## 2017-07-19 DIAGNOSIS — F17220 Nicotine dependence, chewing tobacco, uncomplicated: Secondary | ICD-10-CM | POA: Diagnosis not present

## 2017-07-19 LAB — CBC WITH DIFFERENTIAL/PLATELET
BASOS ABS: 0 10*3/uL (ref 0.0–0.1)
Basophils Relative: 0 %
EOS ABS: 0.1 10*3/uL (ref 0.0–0.7)
Eosinophils Relative: 1 %
HEMATOCRIT: 46.4 % (ref 39.0–52.0)
Hemoglobin: 15.8 g/dL (ref 13.0–17.0)
LYMPHS ABS: 6.4 10*3/uL — AB (ref 0.7–4.0)
Lymphocytes Relative: 56 %
MCH: 30.1 pg (ref 26.0–34.0)
MCHC: 34.1 g/dL (ref 30.0–36.0)
MCV: 88.4 fL (ref 78.0–100.0)
Monocytes Absolute: 0.6 10*3/uL (ref 0.1–1.0)
Monocytes Relative: 5 %
Neutro Abs: 4.3 10*3/uL (ref 1.7–7.7)
Neutrophils Relative %: 38 %
Platelets: 326 10*3/uL (ref 150–400)
RBC: 5.25 MIL/uL (ref 4.22–5.81)
RDW: 12.9 % (ref 11.5–15.5)
WBC: 11.4 10*3/uL — ABNORMAL HIGH (ref 4.0–10.5)

## 2017-07-19 LAB — BASIC METABOLIC PANEL
Anion gap: 16 — ABNORMAL HIGH (ref 5–15)
BUN: 16 mg/dL (ref 6–20)
CALCIUM: 9.4 mg/dL (ref 8.9–10.3)
CO2: 20 mmol/L — AB (ref 22–32)
CREATININE: 1.16 mg/dL (ref 0.61–1.24)
Chloride: 109 mmol/L (ref 101–111)
GFR calc non Af Amer: >60 mL/min (ref >60)
Glucose, Bld: 124 mg/dL — ABNORMAL HIGH (ref 65–99)
Potassium: 3.8 mmol/L (ref 3.5–5.1)
SODIUM: 145 mmol/L (ref 135–145)

## 2017-07-19 MED ORDER — OXYCODONE-ACETAMINOPHEN 5-325 MG PO TABS: 1.0000 | ORAL_TABLET | ORAL | 0 refills | Status: DC | PRN

## 2017-07-19 MED ORDER — HYDROMORPHONE HCL 1 MG/ML IJ SOLN
1.0000 mg | Freq: Once | INTRAMUSCULAR | Status: AC
  Administered 2017-07-19: 1 mg via INTRAVENOUS
  Filled 2017-07-19: qty 1

## 2017-07-19 MED ORDER — TAMSULOSIN HCL 0.4 MG PO CAPS: 0.4000 mg | ORAL_CAPSULE | Freq: Every day | ORAL | 0 refills | Status: DC

## 2017-07-19 MED ORDER — ONDANSETRON HCL 4 MG/2ML IJ SOLN
4.0000 mg | Freq: Once | INTRAMUSCULAR | Status: AC
  Administered 2017-07-19: 4 mg via INTRAVENOUS
  Filled 2017-07-19: qty 2

## 2017-07-19 MED ORDER — ONDANSETRON HCL 4 MG PO TABS: 4.0000 mg | ORAL_TABLET | Freq: Four times a day (QID) | ORAL | 0 refills | Status: DC

## 2017-07-19 MED ORDER — MORPHINE SULFATE (PF) 4 MG/ML IV SOLN
4.0000 mg | Freq: Once | INTRAVENOUS | Status: AC
  Administered 2017-07-19: 4 mg via INTRAVENOUS
  Filled 2017-07-19: qty 1

## 2017-07-19 MED ORDER — SODIUM CHLORIDE 0.9 % IV BOLUS (SEPSIS)
500.0000 mL | Freq: Once | INTRAVENOUS | Status: AC
  Administered 2017-07-19: 500 mL via INTRAVENOUS

## 2017-07-19 MED ORDER — KETOROLAC TROMETHAMINE 30 MG/ML IJ SOLN
30.0000 mg | Freq: Once | INTRAMUSCULAR | Status: AC
  Administered 2017-07-19: 30 mg via INTRAVENOUS
  Filled 2017-07-19: qty 1

## 2017-07-19 NOTE — ED Triage Notes (Signed)
R flank pain x one hour  History of kidney stones  [facility] is   Pt is smiling in triage

## 2017-07-19 NOTE — Discharge Instructions (Signed)
You have 2 kidney stones in the right ureter, 2 mm and 5 mm.  Prescription for pain medicine, nausea medicine, and medication to increase your urine flow.  Follow-up with urologist.  Phone number given.

## 2017-07-19 NOTE — ED Provider Notes (Addendum)
[facility] EMERGENCY DEPARTMENT Provider Note   CSN: [id number] Arrival date & time: 07/19/17  2028     History   Chief Complaint Chief Complaint  Patient presents with  . Flank Pain    HPI Kent Goodwin is a 37 y.o. male.  Patient presents with abrupt onset of right flank pain approximately 1 hour prior to visit.  He has a known history of multiple kidney stones.  No dysuria, hematuria, fever, sweats, chills.  Severity of pain is severe.  Nothing makes symptoms better or worse.      Past Medical History:  Diagnosis Date  . Complication of anesthesia    SLOW TO WAKE  . Gross hematuria   . History of kidney stones   . Left ureteral stone   . Renal calculus, left   . Urgency of urination     Patient Active Problem List   Diagnosis Date Noted  . NEPHROLITHIASIS 09/05/2008  . CHEST PAIN, ACUTE 08/29/2008  . SMOKELESS TOBACCO ABUSE 03/23/2008  . BACK PAIN, LUMBAR, CHRONIC 03/23/2008    Past Surgical History:  Procedure Laterality Date  . CYSTO/  LEFT RETROGRADE PYELOGRAM/  LEFT URETERAL STENT PLACEMENT  01/14/2011  . CYSTO/  URETHRAL DILATION/  RIGHT URETEROSCOPIC STONE EXTRACTION  02/16/2006  . CYSTOSCOPY/URETEROSCOPY/HOLMIUM LASER/STENT PLACEMENT Left 05/21/2016   Procedure: CYSTOSCOPY/ RETROGRADE PYELOGRAM/ URETEROSCOPY/HOLMIUM LASER/STENT PLACEMENT;  Surgeon: Sebastian Ache, MD;  Location: [facility];  Service: Urology;  Laterality: Left;  . LUMBAR FUSION  2006   L5 -- S1       Home Medications    Prior to Admission medications   Medication Sig Start Date End Date Taking? Authorizing Provider  cephALEXin (KEFLEX) 500 MG capsule Take 1 capsule (500 mg total) by mouth 2 (two) times daily. X 3 days to prevent infection with stent in place. 05/21/16   Sebastian Ache, MD  ibuprofen (ADVIL,MOTRIN) 200 MG tablet Take 400-600 mg by mouth every 6 (six) hours as needed for moderate pain.    [provider]  ketorolac (TORADOL) 10 MG  tablet Take 1 tablet (10 mg total) by mouth every 6 (six) hours as needed for moderate pain. Or stent discomfort post-operatively. 05/21/16   Sebastian Ache, MD  ondansetron (ZOFRAN ODT) 4 MG disintegrating tablet 4mg  ODT q4 hours prn nausea/vomit 04/08/16   Bethann Berkshire, MD  ondansetron (ZOFRAN) 4 MG tablet Take 1 tablet (4 mg total) by mouth every 6 (six) hours. 07/19/17   Donnetta Hutching, MD  oxyCODONE-acetaminophen (PERCOCET) 5-325 MG tablet Take 1-2 tablets by mouth every 4 (four) hours as needed. 07/19/17   Donnetta Hutching, MD  oxyCODONE-acetaminophen (PERCOCET) 5-325 MG tablet Take 1-2 tablets by mouth every 4 (four) hours as needed. 07/19/17   Donnetta Hutching, MD  senna-docusate (SENOKOT-S) 8.6-50 MG tablet Take 1 tablet by mouth 2 (two) times daily. While taking strong pain meds to prevent constipation. 05/21/16   Sebastian Ache, MD  tamsulosin (FLOMAX) 0.4 MG CAPS capsule Take 1 capsule (0.4 mg total) by mouth daily. 07/19/17   Donnetta Hutching, MD  pantoprazole (PROTONIX) 20 MG tablet Take 1 tablet (20 mg total) by mouth daily. Patient not taking: Reported on 10/11/2014 04/18/13 10/11/14  Donnetta Hutching, MD    Family History No family history on file.  Social History Social History   Tobacco Use  . Smoking status: Former Smoker    Packs/day: 0.25    Years: 13.00    Pack years: 3.25    Types: Cigarettes    Last  attempt to quit: 05/16/2014    Years since quitting: 3.1  . Smokeless tobacco: Current User    Types: Snuff  Substance Use Topics  . Alcohol use: No  . Drug use: No     Allergies   Patient has no known allergies.   Review of Systems Review of Systems   Physical Exam Updated Vital Signs BP (!) 159/107 (BP Location: Right Arm)   Pulse (!) 106   Temp 98.2 F (36.8 C) (Oral)   Resp 20   Ht 6\' 3"  (1.905 m)   Wt 102.1 kg (225 lb)   SpO2 97%   BMI 28.12 kg/m   Physical Exam  Constitutional: He is oriented to person, place, and time. He appears well-developed and well-nourished.    HENT:  Head: Normocephalic and atraumatic.  Eyes: Conjunctivae are normal.  Neck: Neck supple.  Cardiovascular: Normal rate and regular rhythm.  Pulmonary/Chest: Effort normal and breath sounds normal.  Abdominal: Soft. Bowel sounds are normal.  Musculoskeletal: Normal range of motion.  Neurological: He is alert and oriented to person, place, and time.  Skin: Skin is warm and dry.  Psychiatric: He has a normal mood and affect. His behavior is normal.  Nursing note and vitals reviewed.  Genitourinary exam: Tender left flank.  ED Treatments / Results  Labs (all labs ordered are listed, but only abnormal results are displayed) Labs Reviewed  CBC WITH DIFFERENTIAL/PLATELET - Abnormal; Notable for the following components:      Result Value   WBC 11.4 (*)    Lymphs Abs 6.4 (*)    All other components within normal limits  BASIC METABOLIC PANEL - Abnormal; Notable for the following components:   CO2 20 (*)    Glucose, Bld 124 (*)    Anion gap 16 (*)    All other components within normal limits  URINALYSIS, ROUTINE W REFLEX MICROSCOPIC    EKG  EKG Interpretation None       Radiology Dg Abdomen 1 View  Result Date: 07/19/2017 CLINICAL DATA:  Right flank pain EXAM: ABDOMEN - 1 VIEW COMPARISON:  CT abdomen and pelvis May 02, 2016 FINDINGS: There is fairly diffuse stool throughout the colon. There is no bowel dilatation or air-fluid level to suggest bowel obstruction. No free air. There is minimal vascular calcification in the pelvis. No other abnormal calcification evident. There is postoperative change at L5-S1. IMPRESSION: Minimal vascular calcification. No other abnormal calcification noted. Note that there is extensive stool throughout the colon which could mask small calculi in kidney regions. No evident bowel obstruction or free air. Electronically Signed   By: Bretta Bang III M.D.   On: 07/19/2017 21:47   Ct Renal Stone Study  Result Date: 07/19/2017 CLINICAL  DATA:  Sharp constant right-sided flank pain and nausea since 7 p.m. History of renal stones. EXAM: CT ABDOMEN AND PELVIS WITHOUT CONTRAST TECHNIQUE: Multidetector CT imaging of the abdomen and pelvis was performed following the standard protocol without IV contrast. COMPARISON:  05/02/2016 CT FINDINGS: Lower chest: No acute abnormality. Hepatobiliary: No focal liver abnormality is seen. No gallstones, gallbladder wall thickening, or biliary dilatation. Pancreas: Unremarkable. No pancreatic ductal dilatation or surrounding inflammatory changes. Spleen: Normal in size without focal abnormality. Adrenals/Urinary Tract: Normal bilateral adrenal glands. Mild proximal right-sided hydroureteronephrosis secondary to 2 tandem renal calculi within the proximal right ureter, the more distal calculus measuring approximately 5 mm and the more proximal measuring 2 mm. Bilateral nonobstructing renal calculi are also noted. Left-sided renal collecting system is  decompressed. Nondistended urinary bladder without focal mural thickening or calculi. Stomach/Bowel: Stomach is within normal limits. Appendix appears normal. No evidence of bowel wall thickening, distention, or inflammatory changes. Vascular/Lymphatic: No significant vascular findings are present. No enlarged abdominal or pelvic lymph nodes. Reproductive: Prostate and seminal vesicles are unremarkable. Other: No ascites or free air. Musculoskeletal: Status post PLIF procedure at L5-S1. No aggressive osseous lesions. IMPRESSION: 1. Two tandem proximal right-sided ureteral calculi at the junction of the proximal and middle third measuring 5 and 2 mm causing mild right-sided hydroureteronephrosis. 2. Bilateral nonobstructing renal calculi are noted, the largest measuring 6 mm in the left lower pole. Electronically Signed   By: Tollie Ethavid  Kwon M.D.   On: 07/19/2017 22:53    Procedures Procedures (including critical care time)  Medications Ordered in ED Medications  sodium  chloride 0.9 % bolus 500 mL (500 mLs Intravenous New Bag/Given 07/19/17 2109)  ondansetron (ZOFRAN) injection 4 mg (4 mg Intravenous Given 07/19/17 2109)  ketorolac (TORADOL) 30 MG/ML injection 30 mg (30 mg Intravenous Given 07/19/17 2109)  HYDROmorphone (DILAUDID) injection 1 mg (1 mg Intravenous Given 07/19/17 2109)  morphine 4 MG/ML injection 4 mg (4 mg Intravenous Given 07/19/17 2156)     Initial Impression / Assessment and Plan / ED Course  I have reviewed the triage vital signs and the nursing notes.  Pertinent labs & imaging results that were available during my care of the patient were reviewed by me and considered in my medical decision making (see chart for details).     History and physical consistent with right-sided kidney stone.  CT scan confirms same.  There are 2 proximal and mid ureteral stones, 2 mm and 5 mm.  Pain management obtained with IV Toradol and IV opiates.  Discharge medications Percocet, Flomax 0.5 mg, Zofran 4 mg.  He will follow-up with urology.  Final Clinical Impressions(s) / ED Diagnoses   Final diagnoses:  Right kidney stone    ED Discharge Orders        Ordered    oxyCODONE-acetaminophen (PERCOCET) 5-325 MG tablet  Every 4 hours PRN     07/19/17 2319    oxyCODONE-acetaminophen (PERCOCET) 5-325 MG tablet  Every 4 hours PRN     07/19/17 2333    ondansetron (ZOFRAN) 4 MG tablet  Every 6 hours     07/19/17 2333    tamsulosin (FLOMAX) 0.4 MG CAPS capsule  Daily     07/19/17 2333       Donnetta Hutchingook, Jaber Dunlow, MD 07/20/17 [id number]    Donnetta Hutchingook, Ailah Barna, MD 08/02/17 [phone]

## 2017-07-21 MED FILL — Oxycodone w/ Acetaminophen Tab 5-325 MG: ORAL | Qty: 6 | Status: AC

## 2017-08-08 ENCOUNTER — Emergency Department (HOSPITAL_COMMUNITY): Payer: Commercial Managed Care - PPO

## 2017-08-08 ENCOUNTER — Emergency Department (HOSPITAL_COMMUNITY)
Admission: EM | Admit: 2017-08-08 | Discharge: 2017-08-08 | Disposition: A | Discharge status: Home / Self Care | Payer: Commercial Managed Care - PPO | Attending: Emergency Medicine | Admitting: Emergency Medicine

## 2017-08-08 ENCOUNTER — Other Ambulatory Visit: Payer: Self-pay

## 2017-08-08 ENCOUNTER — Encounter (HOSPITAL_COMMUNITY): Payer: Self-pay | Admitting: *Deleted

## 2017-08-08 DIAGNOSIS — Z87442 Personal history of urinary calculi: Secondary | ICD-10-CM | POA: Insufficient documentation

## 2017-08-08 DIAGNOSIS — R110 Nausea: Secondary | ICD-10-CM | POA: Diagnosis not present

## 2017-08-08 DIAGNOSIS — R319 Hematuria, unspecified: Secondary | ICD-10-CM | POA: Insufficient documentation

## 2017-08-08 DIAGNOSIS — R1031 Right lower quadrant pain: Secondary | ICD-10-CM | POA: Diagnosis present

## 2017-08-08 DIAGNOSIS — N200 Calculus of kidney: Secondary | ICD-10-CM | POA: Insufficient documentation

## 2017-08-08 DIAGNOSIS — F17290 Nicotine dependence, other tobacco product, uncomplicated: Secondary | ICD-10-CM | POA: Insufficient documentation

## 2017-08-08 DIAGNOSIS — N23 Unspecified renal colic: Secondary | ICD-10-CM

## 2017-08-08 LAB — URINALYSIS, ROUTINE W REFLEX MICROSCOPIC
BILIRUBIN URINE: NEGATIVE
Glucose, UA: NEGATIVE mg/dL
Ketones, ur: NEGATIVE mg/dL
NITRITE: NEGATIVE
Protein, ur: 100 mg/dL — AB
pH: 5.5 (ref 5.0–8.0)

## 2017-08-08 LAB — CBC
HEMATOCRIT: 45.7 % (ref 39.0–52.0)
Hemoglobin: 16.1 g/dL (ref 13.0–17.0)
MCH: 30.8 pg (ref 26.0–34.0)
MCHC: 35.2 g/dL (ref 30.0–36.0)
MCV: 87.5 fL (ref 78.0–100.0)
PLATELETS: 243 10*3/uL (ref 150–400)
RBC: 5.22 MIL/uL (ref 4.22–5.81)
RDW: 12.9 % (ref 11.5–15.5)
WBC: 7.2 10*3/uL (ref 4.0–10.5)

## 2017-08-08 LAB — URINALYSIS, MICROSCOPIC (REFLEX)

## 2017-08-08 LAB — BASIC METABOLIC PANEL
ANION GAP: 10 (ref 5–15)
BUN: 13 mg/dL (ref 6–20)
CO2: 22 mmol/L (ref 22–32)
Calcium: 9.3 mg/dL (ref 8.9–10.3)
Chloride: 106 mmol/L (ref 101–111)
Creatinine, Ser: 1.12 mg/dL (ref 0.61–1.24)
GFR calc Af Amer: >60 mL/min (ref >60)
GFR calc non Af Amer: >60 mL/min (ref >60)
Glucose, Bld: 90 mg/dL (ref 65–99)
POTASSIUM: 4.4 mmol/L (ref 3.5–5.1)
Sodium: 138 mmol/L (ref 135–145)

## 2017-08-08 MED ORDER — TAMSULOSIN HCL 0.4 MG PO CAPS: 0.4000 mg | ORAL_CAPSULE | Freq: Every day | ORAL | 0 refills | Status: DC

## 2017-08-08 MED ORDER — HYDROMORPHONE HCL 1 MG/ML IJ SOLN
1.0000 mg | Freq: Once | INTRAMUSCULAR | Status: AC
  Administered 2017-08-08: 1 mg via INTRAVENOUS
  Filled 2017-08-08: qty 1

## 2017-08-08 MED ORDER — OXYCODONE-ACETAMINOPHEN 5-325 MG PO TABS
1.0000 | ORAL_TABLET | Freq: Once | ORAL | Status: AC
  Administered 2017-08-08: 1 via ORAL
  Filled 2017-08-08: qty 1

## 2017-08-08 MED ORDER — ONDANSETRON HCL 4 MG/2ML IJ SOLN
4.0000 mg | Freq: Once | INTRAMUSCULAR | Status: AC
  Administered 2017-08-08: 4 mg via INTRAVENOUS
  Filled 2017-08-08: qty 2

## 2017-08-08 MED ORDER — HYDROCODONE-ACETAMINOPHEN 5-325 MG PO TABS: 1.0000 | ORAL_TABLET | ORAL | 0 refills | Status: DC | PRN

## 2017-08-08 MED ORDER — ONDANSETRON 8 MG PO TBDP: 8.0000 mg | ORAL_TABLET | Freq: Three times a day (TID) | ORAL | 0 refills | Status: DC | PRN

## 2017-08-08 MED ORDER — KETOROLAC TROMETHAMINE 30 MG/ML IJ SOLN
30.0000 mg | Freq: Once | INTRAMUSCULAR | Status: AC
  Administered 2017-08-08: 30 mg via INTRAVENOUS
  Filled 2017-08-08: qty 1

## 2017-08-08 NOTE — ED Provider Notes (Signed)
Signed out by Dr Patria Maneampos that d/c meds/papers done, do d/c to home when u/s is resulted.  U/s reviewed - no gross hydro - discussed w pt. Patient indicates pain is much improved. Afebrile. Pt appears stable for d/c.     Cathren LaineSteinl, Kami Kube, MD 08/08/17 [phone]

## 2017-08-08 NOTE — ED Notes (Addendum)
Spoke with US, they will be another hour due to being called in to a procedure

## 2017-08-08 NOTE — ED Triage Notes (Signed)
Rt flank pain, history of 2 kidney stones dx on 07/19/17. States he has not passed either stone that he is aware of.

## 2017-08-08 NOTE — Discharge Instructions (Addendum)
It was our pleasure to provide your ER care today - we hope that you feel better.  Take motrin or aleve as need for pain. You may also take hydrocodone as need for pain. No driving for the next 6 hours or when taking hydrocodone. Also, do not take tylenol or acetaminophen containing medication when taking hydrocodone.  Take your zofran as need for nausea.  Take flomax as prescribed.  Strain urine.   Follow up with urologist in the coming week - call office Monday to arrange appointment.  Return to ER if worse, new symptoms, fevers, intractable pain, other concern.   You were given pain meds in the ER - no driving for the next 6 hours.  Your blood pressure is high today - follow up with primary care doctor in 1-2 weeks.

## 2017-08-08 NOTE — ED Provider Notes (Signed)
[facility]-EMERGENCY DEPT Provider Note   CSN: [id number] Arrival date & time: 08/08/17  1133     History   Chief Complaint Chief Complaint  Patient presents with  . Flank Pain    Rt side    HPI ZAKARIYYA HELFMAN is a 37 y.o. male.  HPI Patient is a 37 year old male with a history of recurrent ureteral stones who presents the emergency department with right flank pain and hematuria.  He denies dysuria and urinary frequency no fevers or chills.  Reports nausea without vomiting.  Reports moderate pain at this time.  His pain is located in his right flank and radiates towards his right groin.  Patient has had multiple CT scans in the past several years.  Majority the time they always demonstrate ureteral stones.   Past Medical History:  Diagnosis Date  . Complication of anesthesia    SLOW TO WAKE  . Gross hematuria   . History of kidney stones   . Left ureteral stone   . Renal calculus, left   . Urgency of urination     Patient Active Problem List   Diagnosis Date Noted  . NEPHROLITHIASIS 09/05/2008  . CHEST PAIN, ACUTE 08/29/2008  . SMOKELESS TOBACCO ABUSE 03/23/2008  . BACK PAIN, LUMBAR, CHRONIC 03/23/2008    Past Surgical History:  Procedure Laterality Date  . CYSTO/  LEFT RETROGRADE PYELOGRAM/  LEFT URETERAL STENT PLACEMENT  01/14/2011  . CYSTO/  URETHRAL DILATION/  RIGHT URETEROSCOPIC STONE EXTRACTION  02/16/2006  . CYSTOSCOPY/URETEROSCOPY/HOLMIUM LASER/STENT PLACEMENT Left 05/21/2016   Procedure: CYSTOSCOPY/ RETROGRADE PYELOGRAM/ URETEROSCOPY/HOLMIUM LASER/STENT PLACEMENT;  Surgeon: Sebastian Ache, MD;  Location: [facility];  Service: Urology;  Laterality: Left;  . LUMBAR FUSION  2006   L5 -- S1        Home Medications    Prior to Admission medications   Medication Sig Start Date End Date Taking? Authorizing Provider  ibuprofen (ADVIL,MOTRIN) 200 MG tablet Take 400-600 mg by mouth every 6 (six) hours as needed for  moderate pain.   Yes [provider]  tamsulosin (FLOMAX) 0.4 MG CAPS capsule Take 1 capsule (0.4 mg total) by mouth daily. 07/19/17  Yes Donnetta Hutching, MD  cephALEXin (KEFLEX) 500 MG capsule Take 1 capsule (500 mg total) by mouth 2 (two) times daily. X 3 days to prevent infection with stent in place. Patient not taking: Reported on 08/08/2017 05/21/16   Sebastian Ache, MD  ketorolac (TORADOL) 10 MG tablet Take 1 tablet (10 mg total) by mouth every 6 (six) hours as needed for moderate pain. Or stent discomfort post-operatively. Patient not taking: Reported on 08/08/2017 05/21/16   Sebastian Ache, MD  ondansetron (ZOFRAN ODT) 4 MG disintegrating tablet 4mg  ODT q4 hours prn nausea/vomit Patient not taking: Reported on 08/08/2017 04/08/16   Bethann Berkshire, MD  ondansetron (ZOFRAN) 4 MG tablet Take 1 tablet (4 mg total) by mouth every 6 (six) hours. Patient not taking: Reported on 08/08/2017 07/19/17   Donnetta Hutching, MD  oxyCODONE-acetaminophen (PERCOCET) 5-325 MG tablet Take 1-2 tablets by mouth every 4 (four) hours as needed. Patient not taking: Reported on 08/08/2017 07/19/17   Donnetta Hutching, MD  oxyCODONE-acetaminophen (PERCOCET) 5-325 MG tablet Take 1-2 tablets by mouth every 4 (four) hours as needed. Patient not taking: Reported on 08/08/2017 07/19/17   Donnetta Hutching, MD  senna-docusate (SENOKOT-S) 8.6-50 MG tablet Take 1 tablet by mouth 2 (two) times daily. While taking strong pain meds to prevent constipation. Patient not taking: Reported on  08/08/2017 05/21/16   Sebastian Ache, MD    Family History No family history on file.  Social History Social History   Tobacco Use  . Smoking status: Former Smoker    Packs/day: 0.25    Years: 13.00    Pack years: 3.25    Types: Cigarettes    Last attempt to quit: 05/16/2014    Years since quitting: 3.2  . Smokeless tobacco: Current User    Types: Snuff  Substance Use Topics  . Alcohol use: No  . Drug use: No     Allergies   Patient has no known  allergies.   Review of Systems Review of Systems  All other systems reviewed and are negative.    Physical Exam Updated Vital Signs BP (!) 140/103 (BP Location: Left Arm)   Pulse 79   Temp 97.8 F (36.6 C) (Oral)   Resp 16   Ht 6\' 3"  (1.905 m)   Wt 106.6 kg (235 lb)   SpO2 95%   BMI 29.37 kg/m   Physical Exam  Constitutional: He is oriented to person, place, and time. He appears well-developed and well-nourished.  Uncomfortable appearing  HENT:  Head: Normocephalic.  Eyes: EOM are normal.  Neck: Normal range of motion.  Cardiovascular: Normal rate.  Pulmonary/Chest: Effort normal and breath sounds normal.  Abdominal: He exhibits no distension.  Musculoskeletal: Normal range of motion.  Neurological: He is alert and oriented to person, place, and time.  Psychiatric: He has a normal mood and affect.  Nursing note and vitals reviewed.    ED Treatments / Results  Labs (all labs ordered are listed, but only abnormal results are displayed) Labs Reviewed  URINALYSIS, ROUTINE W REFLEX MICROSCOPIC - Abnormal; Notable for the following components:      Result Value   Color, Urine BROWN (*)    APPearance TURBID (*)    Specific Gravity, Urine >1.030 (*)    Hgb urine dipstick LARGE (*)    Protein, ur 100 (*)    Leukocytes, UA TRACE (*)    All other components within normal limits  URINALYSIS, MICROSCOPIC (REFLEX) - Abnormal; Notable for the following components:   Bacteria, UA FEW (*)    Squamous Epithelial / LPF 0-5 (*)    All other components within normal limits  CBC  BASIC METABOLIC PANEL    EKG None  Radiology No results found.  Procedures Procedures (including critical care time)  Medications Ordered in ED Medications  ondansetron (ZOFRAN) injection 4 mg (4 mg Intravenous Given 08/08/17 1216)  ketorolac (TORADOL) 30 MG/ML injection 30 mg (30 mg Intravenous Given 08/08/17 1218)  HYDROmorphone (DILAUDID) injection 1 mg (1 mg Intravenous Given 08/08/17 1218)    oxyCODONE-acetaminophen (PERCOCET/ROXICET) 5-325 MG per tablet 1 tablet (1 tablet Oral Given 08/08/17 1316)  oxyCODONE-acetaminophen (PERCOCET/ROXICET) 5-325 MG per tablet 1 tablet (1 tablet Oral Given 08/08/17 1454)     Initial Impression / Assessment and Plan / ED Course  I have reviewed the triage vital signs and the nursing notes.  Pertinent labs & imaging results that were available during my care of the patient were reviewed by me and considered in my medical decision making (see chart for details).     Symptoms consistent with right ureteral colic.  Pain improving.  Ultrasound pending at this time.  Patient will need outpatient urology follow-up.    Final Clinical Impressions(s) / ED Diagnoses   Final diagnoses:  None    ED Discharge Orders    None  Azalia Bilisampos, Hailei Besser, MD 08/08/17 [phone]

## 2017-08-08 NOTE — ED Notes (Signed)
Urine results given verbally from lab due to power outage: Color- brown Turbid Negative glucose Neg bilirubin Neg ketones Spec gravity 1.030 Blood- large PH 5.5 Protein 100 mg/dL Nitrate neg Leukocytes neg

## 2018-04-20 ENCOUNTER — Other Ambulatory Visit: Payer: Self-pay | Admitting: Oral Surgery

## 2019-10-30 IMAGING — US US RENAL
1 series · 14 of 25 positions shown · non-contrast
Comparison: Abdomen ultrasound April 08, 2016

CLINICAL DATA: Right flank pain for 3 days

EXAM:
RENAL / URINARY TRACT ULTRASOUND COMPLETE

[Series 1: us renal · 0.25mm/px · 14 of 27 slices shown]
[im 1/27]
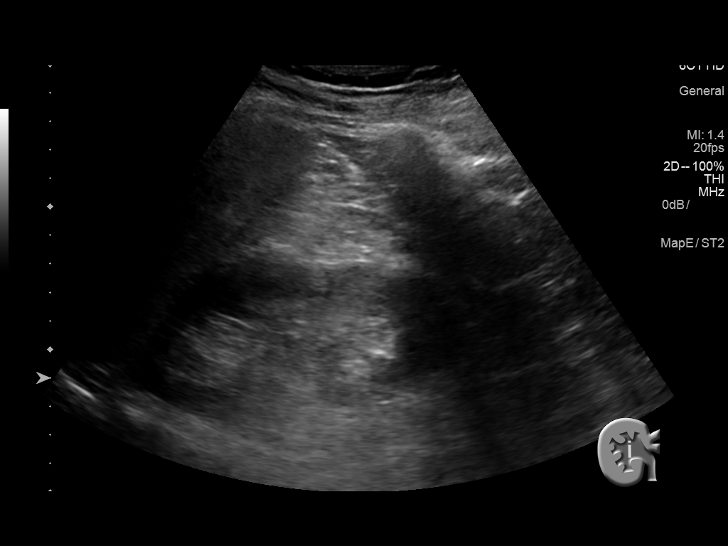
[im 3/27]
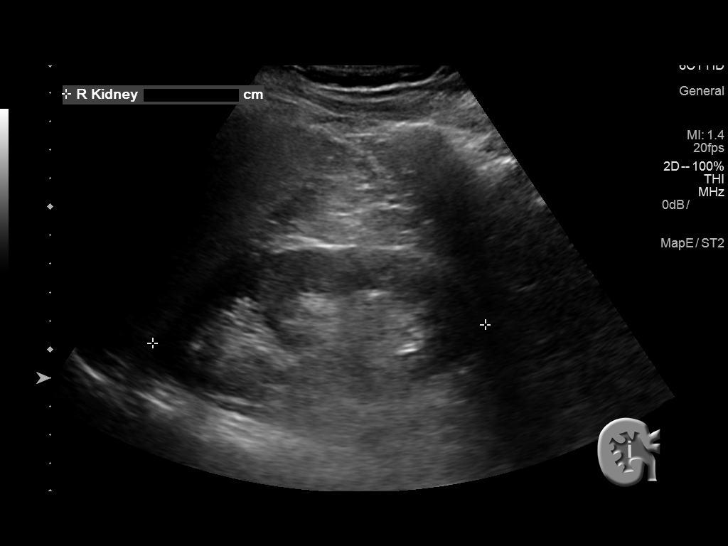
[im 5/27]
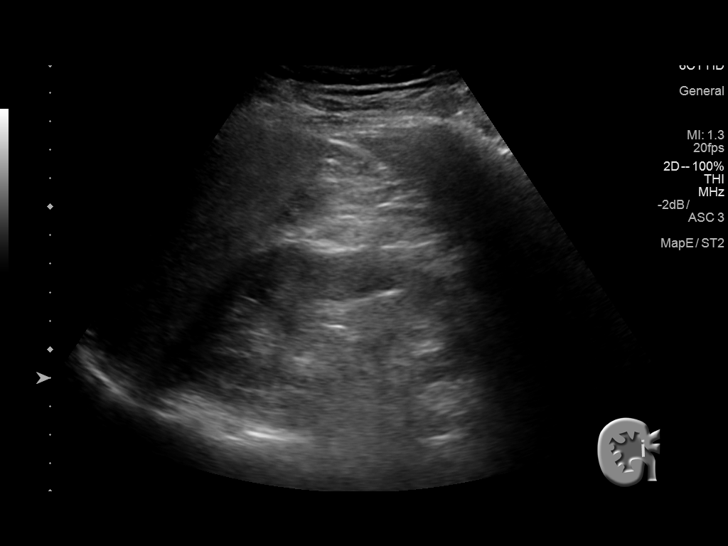
[im 7/27]
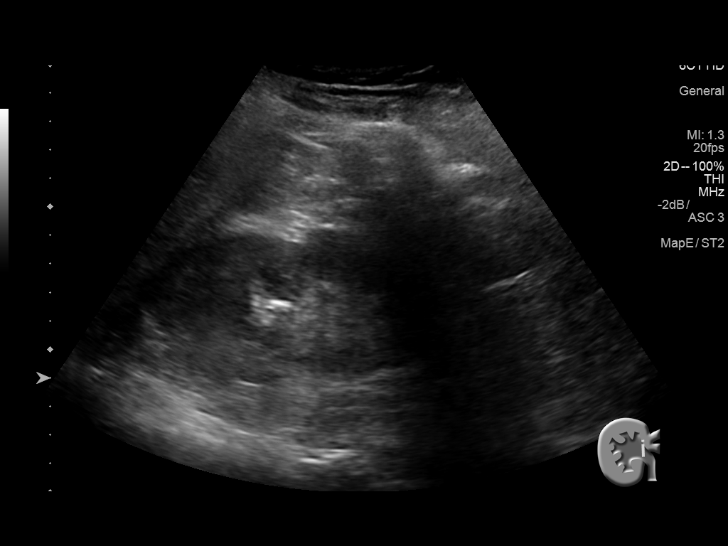
[im 9/27]
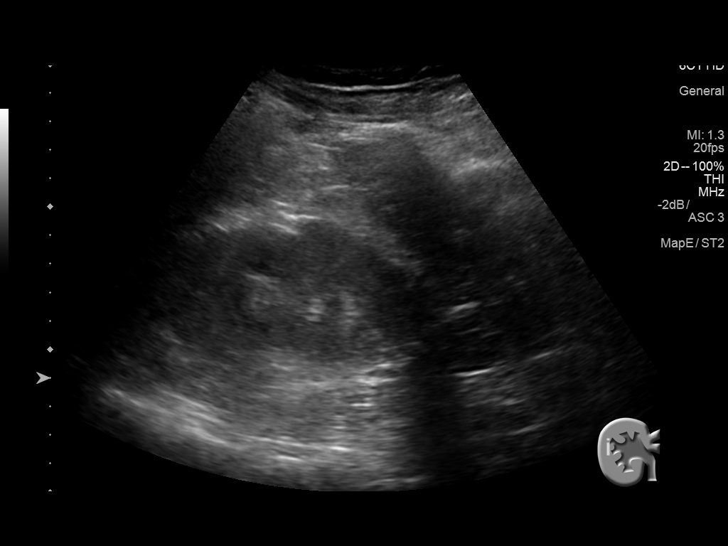
[im 10/27]
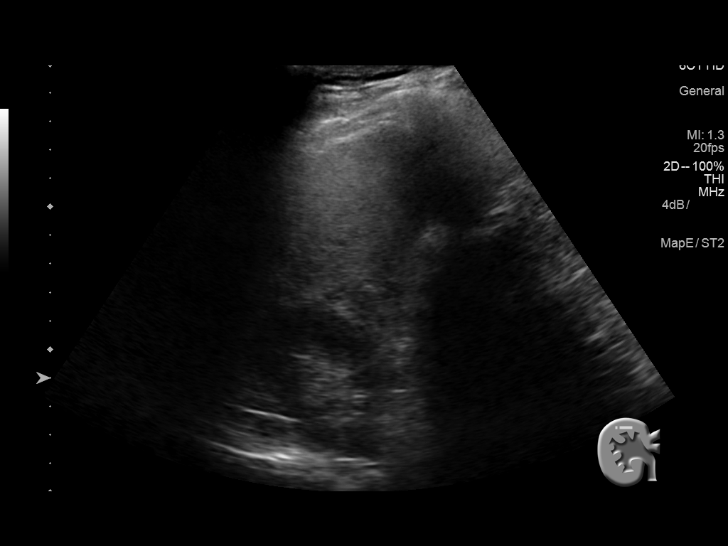
[im 12/27]
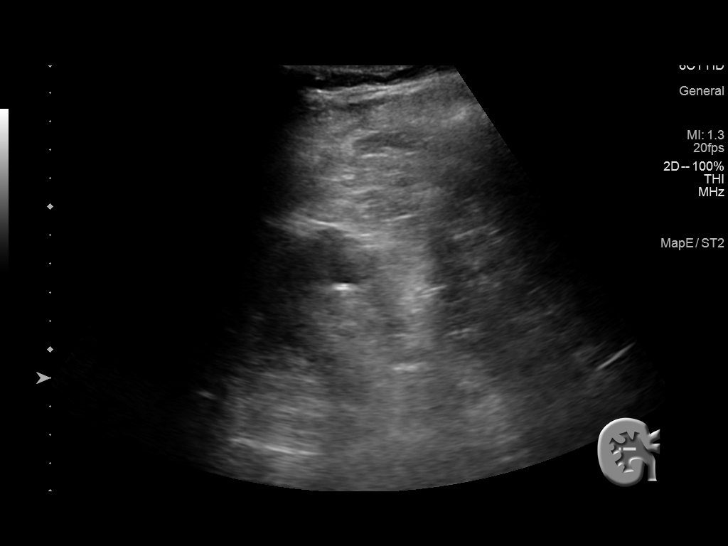
[im 15/27]
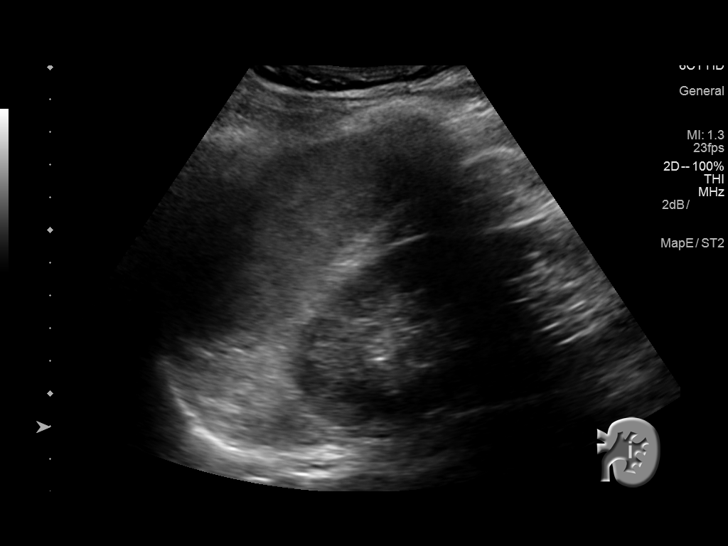
[im 17/27]
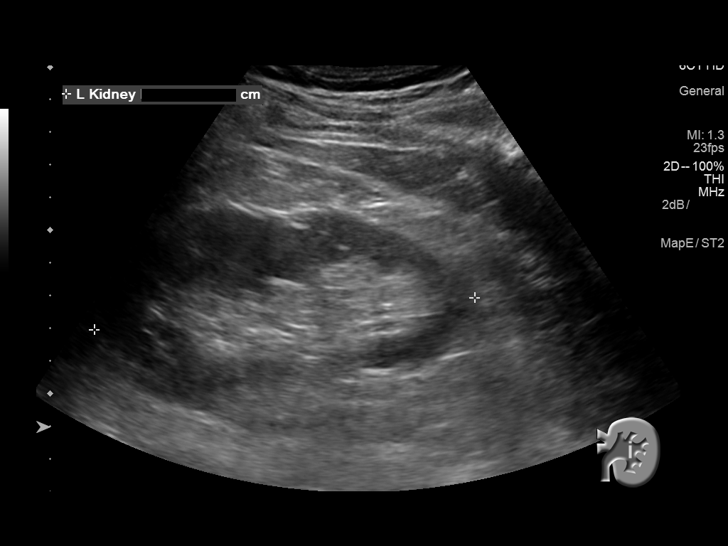
[im 18/27]
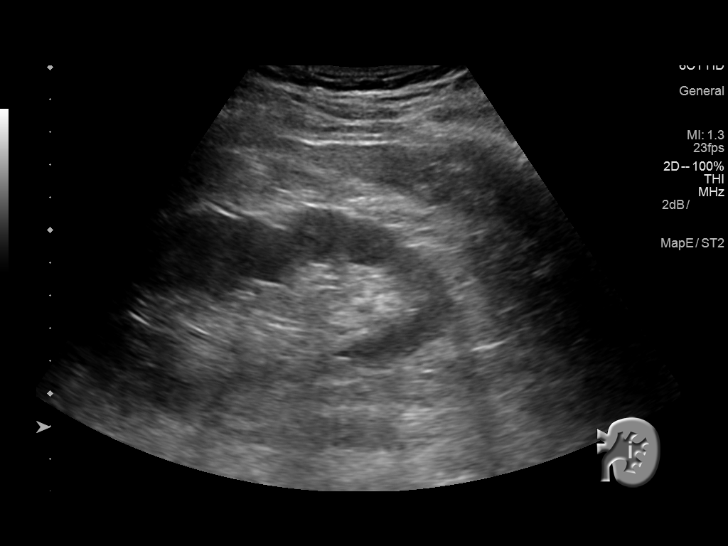
[im 20/27]
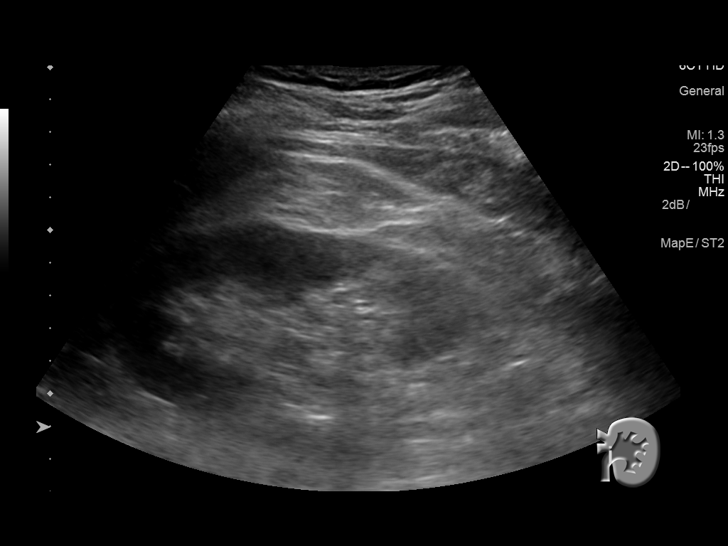
[im 22/27]
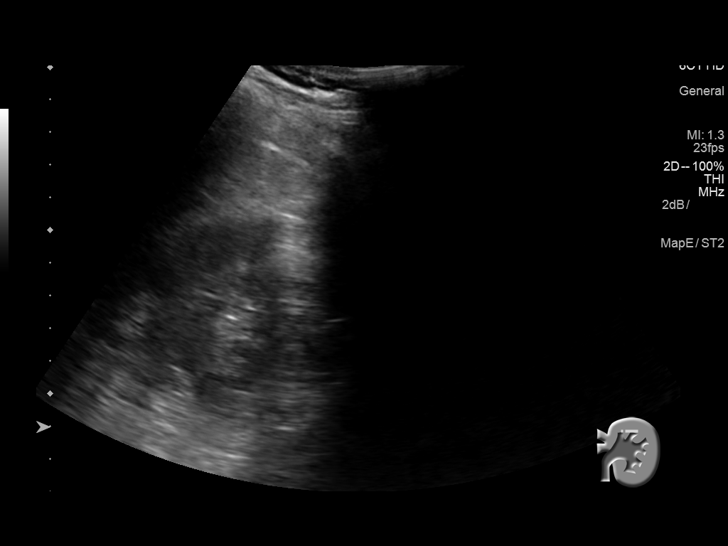
[im 24/27]
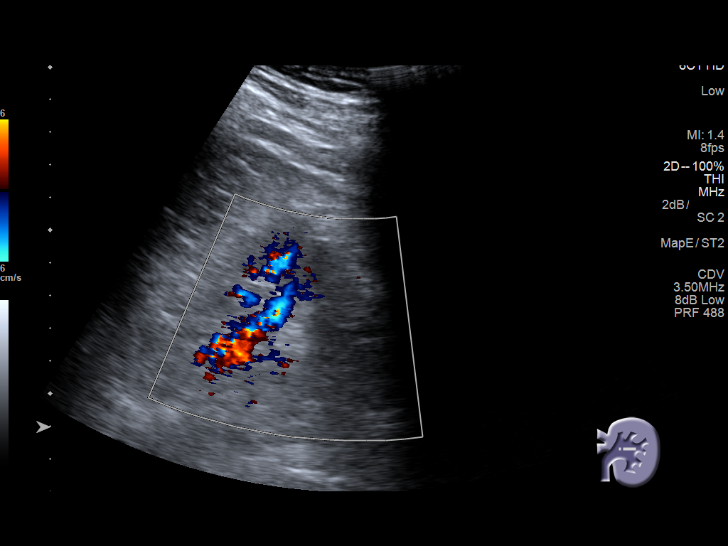
[im 27/27]
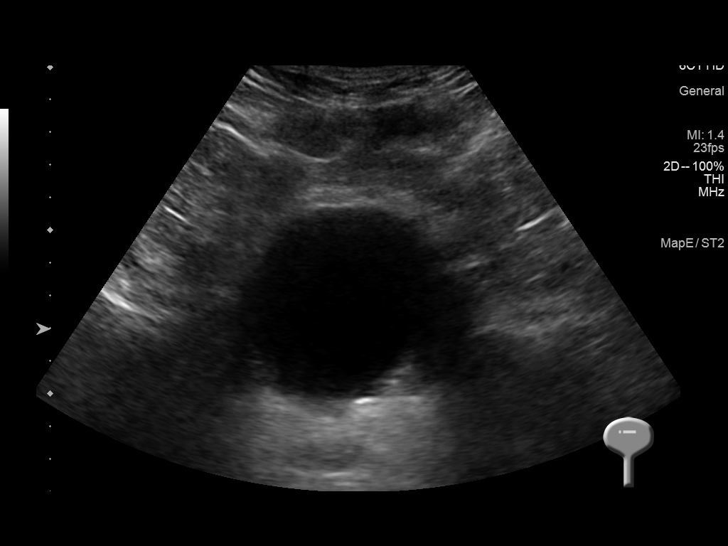

[14 of 25 positions shown; findings below may reference images not displayed]

FINDINGS: Right Kidney:

Length: 11.7 cm. Echogenicity within normal limits. No mass or
hydronephrosis visualized. There is a 7.5 mm nonobstructing stone in
the midpole right kidney.

Left Kidney:

Length: 11.7 cm. Echogenicity within normal limits. No mass or
hydronephrosis visualized.

Bladder:

Appears normal for degree of bladder distention.
IMPRESSION: No hydronephrosis bilaterally. Nonobstructing stone in the midpole
right kidney.

## 2020-10-21 ENCOUNTER — Encounter (HOSPITAL_COMMUNITY): Payer: Self-pay | Admitting: *Deleted

## 2020-10-21 ENCOUNTER — Emergency Department (HOSPITAL_COMMUNITY)
Admission: EM | Admit: 2020-10-21 | Discharge: 2020-10-21 | Disposition: A | Discharge status: Home / Self Care | Payer: Commercial Managed Care - PPO | Attending: Emergency Medicine | Admitting: Emergency Medicine

## 2020-10-21 DIAGNOSIS — Z87891 Personal history of nicotine dependence: Secondary | ICD-10-CM | POA: Insufficient documentation

## 2020-10-21 DIAGNOSIS — M5441 Lumbago with sciatica, right side: Secondary | ICD-10-CM | POA: Insufficient documentation

## 2020-10-21 MED ORDER — CYCLOBENZAPRINE HCL 10 MG PO TABS: 10.0000 mg | ORAL_TABLET | Freq: Two times a day (BID) | ORAL | 0 refills | Status: DC | PRN

## 2020-10-21 MED ORDER — KETOROLAC TROMETHAMINE 60 MG/2ML IM SOLN
15.0000 mg | Freq: Once | INTRAMUSCULAR | Status: AC
  Administered 2020-10-21: 15:00:00 15 mg via INTRAMUSCULAR
  Filled 2020-10-21: qty 2

## 2020-10-21 MED ORDER — PREDNISONE 20 MG PO TABS: 40.0000 mg | ORAL_TABLET | Freq: Every day | ORAL | 0 refills | Status: AC

## 2020-10-21 NOTE — ED Triage Notes (Signed)
Low back pain radiating into right leg onset last night

## 2020-10-21 NOTE — ED Provider Notes (Signed)
[facility] EMERGENCY DEPARTMENT Provider Note   CSN: [id number] Arrival date & time: 10/21/20  1347     History Chief Complaint  Patient presents with   Back Pain    Kent Goodwin is a 40 y.o. male.  HPI  Patient with significant medical history of kidney stones, presents with chief complaint of back pain.  Patient states back pain started yesterday, came on suddenly while he was laying in bed, states it was a dull sensation at first but this morning woke up and felt a lot worse.  He states it hurts only when he bends or extends at his back, states ambulating does not make it any worse, the pain does radiate down to his right lower leg, will feel occasional paresthesias in that leg, he denies urinary symptoms, denies incontinence, retention, difficult bowel movements, denies saddle paresthesias.  Patient denies recent trauma to the area, denies history of IV drug use, he has no associated fevers or chills, does not remember any inciting incident.  patient has not tried any pain medication at home.  He denies  alleviating factors.  Patient has headaches, fevers, chills, short of breath or chest pain.  Past Medical History:  Diagnosis Date   Complication of anesthesia    SLOW TO WAKE   Gross hematuria    History of kidney stones    Left ureteral stone    Renal calculus, left    Urgency of urination     Patient Active Problem List   Diagnosis Date Noted   NEPHROLITHIASIS 09/05/2008   CHEST PAIN, ACUTE 08/29/2008   SMOKELESS TOBACCO ABUSE 03/23/2008   BACK PAIN, LUMBAR, CHRONIC 03/23/2008    Past Surgical History:  Procedure Laterality Date   CYSTO/  LEFT RETROGRADE PYELOGRAM/  LEFT URETERAL STENT PLACEMENT  01/14/2011   CYSTO/  URETHRAL DILATION/  RIGHT URETEROSCOPIC STONE EXTRACTION  02/16/2006   CYSTOSCOPY/URETEROSCOPY/HOLMIUM LASER/STENT PLACEMENT Left 05/21/2016   Procedure: CYSTOSCOPY/ RETROGRADE PYELOGRAM/ URETEROSCOPY/HOLMIUM LASER/STENT PLACEMENT;  Surgeon: Sebastian Ache, MD;  [facility];  Service: Urology;  Laterality: Left;   LUMBAR FUSION  2006   L5 -- S1       No family history on file.  Social History   Tobacco Use   Smoking status: Former    Packs/day: 0.25    Years: 13.00    Pack years: 3.25    Types: Cigarettes    Quit date: 05/16/2014    Years since quitting: 6.4   Smokeless tobacco: Current    Types: Snuff  Substance Use Topics   Alcohol use: No   Drug use: No    Home Medications Prior to Admission medications   Medication Sig Start Date End Date Taking? Authorizing Provider  cyclobenzaprine (FLEXERIL) 10 MG tablet Take 1 tablet (10 mg total) by mouth 2 (two) times daily as needed for muscle spasms. 10/21/20  Yes Carroll Sage, PA-C  predniSONE (DELTASONE) 20 MG tablet Take 2 tablets (40 mg total) by mouth daily for 5 days. 10/21/20 10/26/20 Yes Carroll Sage, PA-C  HYDROcodone-acetaminophen (NORCO/VICODIN) 5-325 MG tablet Take 1 tablet by mouth every 4 (four) hours as needed for moderate pain. 08/08/17   Azalia Bilis, MD  ondansetron (ZOFRAN ODT) 8 MG disintegrating tablet Take 1 tablet (8 mg total) by mouth every 8 (eight) hours as needed for nausea or vomiting. 08/08/17   Azalia Bilis, MD  tamsulosin (FLOMAX) 0.4 MG CAPS capsule Take 1 capsule (0.4 mg total) by mouth daily. 08/08/17  Azalia Bilis, MD    Allergies    Patient has no known allergies.  Review of Systems   Review of Systems  Constitutional:  Negative for chills and fever.  HENT:  Negative for congestion.   Respiratory:  Negative for shortness of breath.   Cardiovascular:  Negative for chest pain.  Gastrointestinal:  Negative for abdominal pain.  Genitourinary:  Negative for enuresis.  Musculoskeletal:  Positive for back pain.  Skin:  Negative for rash.  Neurological:  Negative for headaches.  Hematological:  Does not bruise/bleed easily.   Physical Exam Updated Vital Signs BP (!) 144/100 (BP Location: Right Arm)    Pulse 88   Temp 98.1 F (36.7 C)   Resp 16   Ht 6\' 3"  (1.905 m)   Wt 102.5 kg   SpO2 97%   BMI 28.25 kg/m   Physical Exam Vitals and nursing note reviewed.  Constitutional:      General: He is not in acute distress.    Appearance: He is not ill-appearing.  HENT:     Head: Normocephalic and atraumatic.     Nose: No congestion.  Eyes:     Conjunctiva/sclera: Conjunctivae normal.  Cardiovascular:     Rate and Rhythm: Normal rate and regular rhythm.     Pulses: Normal pulses.     Heart sounds: No murmur heard.   No friction rub. No gallop.  Pulmonary:     Effort: No respiratory distress.     Breath sounds: No wheezing, rhonchi or rales.  Abdominal:     Palpations: Abdomen is soft.     Tenderness: There is no abdominal tenderness. There is no right CVA tenderness or left CVA tenderness.  Musculoskeletal:     Right lower leg: No edema.     Left lower leg: No edema.     Comments: Patient's back was palpated was nontender to palpation, no step-off or deformities present, patient had noted tenderness along his paraspinal muscles worse on the left versus the right.  Positive straight leg raise, he has  full range of motion in the lower extremities, neurovascular fully intact.  He is able to ambulate under his own accord.  Skin:    General: Skin is warm and dry.  Neurological:     Mental Status: He is alert.  Psychiatric:        Mood and Affect: Mood normal.    ED Results / Procedures / Treatments   Labs (all labs ordered are listed, but only abnormal results are displayed) Labs Reviewed - No data to display  EKG None  Radiology No results found.  Procedures Procedures   Medications Ordered in ED Medications  ketorolac (TORADOL) injection 15 mg (15 mg Intramuscular Given 10/21/20 1517)    ED Course  I have reviewed the triage vital signs and the nursing notes.  Pertinent labs & imaging results that were available during my care of the patient were reviewed by me  and considered in my medical decision making (see chart for details).    MDM Rules/Calculators/A&P                         Initial impression-patient presents with lower back pain.  He is alert, does not appear acute distress, vital signs reassuring.  Work-up-due to well-appearing patient, benign physical exam, further lab or imaging not warranted at this time.  Rule out- I have low suspicion for spinal fracture or spinal cord abnormality as patient denies urinary incontinency,  retention, difficulty with bowel movements, denies saddle paresthesias.  Spine was palpated there is no step-off, crepitus or gross deformities felt, patient had 5/5 strength, full range of motion, neurovascular fully intact in the lower extremities.  Will defer imaging as there is no traumatic injury involved.  Low suspicion for septic arthritis as patient denies IV drug use, skin exam was performed no erythematous, edema or warm joints noted.  Low suspicion for AAA as patient has low risk factors, etiology is inconsistent.  Low suspicion for UTI, pyelonephritis, kidney stone as patient denies urinary symptoms, no CVA tenderness present on my exam, no abdominal pain.   Plan-  Back pain-suspect musculoskeletal, will start him on muscle relaxers, steroids, have him follow-up with neurosurgery for further evaluation.  Vital signs have remained stable, no indication for hospital admission. Patient given at home care as well strict return precautions.  Patient verbalized that they understood agreed to said plan.  Final Clinical Impression(s) / ED Diagnoses Final diagnoses:  Acute left-sided low back pain with right-sided sciatica    Rx / DC Orders ED Discharge Orders          Ordered    cyclobenzaprine (FLEXERIL) 10 MG tablet  2 times daily PRN        10/21/20 1508    predniSONE (DELTASONE) 20 MG tablet  Daily        10/21/20 1508             Carroll Sage, PA-C 10/21/20 1729    Bethann Berkshire,  MD 10/22/20 [phone]

## 2020-10-21 NOTE — Discharge Instructions (Addendum)
You have been seen here for back,.  Given you a prescription for a muscle relaxer and steroids please take as prescribed.  muscle relaxers can make you drowsy do not consume alcohol or operate heavy machinery when taking this medication.  I recommend taking over-the-counter pain medications like ibuprofen and/or Tylenol every 6 as needed.  Please follow dosage and on the back of bottle.  I also recommend applying heat to the area and stretching out the muscles as this will help decrease stiffness and pain.  I have given you information on exercises please follow.  Please follow-up with neurosurgery for further evaluation if pain does not improve after 2 weeks time.  Come back to the emergency department if you develop chest pain, shortness of breath, severe abdominal pain, uncontrolled nausea, vomiting, diarrhea.

## 2021-09-03 ENCOUNTER — Telehealth: Payer: Self-pay

## 2021-09-03 ENCOUNTER — Emergency Department (HOSPITAL_COMMUNITY): Payer: Self-pay

## 2021-09-03 ENCOUNTER — Emergency Department (HOSPITAL_COMMUNITY)
Admission: EM | Admit: 2021-09-03 | Discharge: 2021-09-03 | Disposition: A | Discharge status: Home / Self Care | Payer: Self-pay | Attending: Emergency Medicine | Admitting: Emergency Medicine

## 2021-09-03 ENCOUNTER — Other Ambulatory Visit: Payer: Self-pay

## 2021-09-03 DIAGNOSIS — N132 Hydronephrosis with renal and ureteral calculous obstruction: Secondary | ICD-10-CM | POA: Insufficient documentation

## 2021-09-03 DIAGNOSIS — N200 Calculus of kidney: Secondary | ICD-10-CM

## 2021-09-03 DIAGNOSIS — R7309 Other abnormal glucose: Secondary | ICD-10-CM | POA: Insufficient documentation

## 2021-09-03 LAB — CBC WITH DIFFERENTIAL/PLATELET
Basophils Absolute: 0 10*3/uL (ref 0.0–0.1)
Basophils Relative: 0 %
Eosinophils Absolute: 0.4 10*3/uL (ref 0.0–0.5)
Eosinophils Relative: 4 %
HCT: 46.1 % (ref 39.0–52.0)
Hemoglobin: 15.9 g/dL (ref 13.0–17.0)
Lymphocytes Relative: 52 %
Lymphs Abs: 4.7 10*3/uL — ABNORMAL HIGH (ref 0.7–4.0)
MCH: 30.8 pg (ref 26.0–34.0)
MCHC: 34.5 g/dL (ref 30.0–36.0)
MCV: 89.3 fL (ref 80.0–100.0)
Monocytes Absolute: 0.6 10*3/uL (ref 0.1–1.0)
Monocytes Relative: 7 %
Neutro Abs: 3.3 10*3/uL (ref 1.7–7.7)
Neutrophils Relative %: 37 %
Platelets: 279 10*3/uL (ref 150–400)
RBC: 5.16 MIL/uL (ref 4.22–5.81)
RDW: 12.2 % (ref 11.5–15.5)
WBC: 9 10*3/uL (ref 4.0–10.5)
nRBC: 0 % (ref 0.0–0.2)

## 2021-09-03 LAB — URINALYSIS, ROUTINE W REFLEX MICROSCOPIC
Bilirubin Urine: NEGATIVE
Glucose, UA: NEGATIVE mg/dL
Ketones, ur: NEGATIVE mg/dL
Leukocytes,Ua: NEGATIVE
Nitrite: NEGATIVE
Protein, ur: NEGATIVE mg/dL
Specific Gravity, Urine: 1.019 (ref 1.005–1.030)
WBC, UA: >50 WBC/hpf — ABNORMAL HIGH (ref 0–5)
pH: 6 (ref 5.0–8.0)

## 2021-09-03 LAB — BASIC METABOLIC PANEL
Anion gap: 7 (ref 5–15)
BUN: 15 mg/dL (ref 6–20)
CO2: 26 mmol/L (ref 22–32)
Calcium: 9.3 mg/dL (ref 8.9–10.3)
Chloride: 106 mmol/L (ref 98–111)
Creatinine, Ser: 1.22 mg/dL (ref 0.61–1.24)
GFR, Estimated: >60 mL/min (ref >60)
Glucose, Bld: 138 mg/dL — ABNORMAL HIGH (ref 70–99)
Potassium: 3.7 mmol/L (ref 3.5–5.1)
Sodium: 139 mmol/L (ref 135–145)

## 2021-09-03 MED ORDER — SODIUM CHLORIDE 0.9 % IV SOLN
2.0000 g | Freq: Once | INTRAVENOUS | Status: AC
  Administered 2021-09-03: 2 g via INTRAVENOUS
  Filled 2021-09-03: qty 20

## 2021-09-03 MED ORDER — HYDROMORPHONE HCL 1 MG/ML IJ SOLN
1.0000 mg | Freq: Once | INTRAMUSCULAR | Status: AC
Start: 2021-09-03 — End: 2021-09-03
  Administered 2021-09-03: 1 mg via INTRAVENOUS
  Filled 2021-09-03: qty 1

## 2021-09-03 MED ORDER — ONDANSETRON HCL 4 MG/2ML IJ SOLN
INTRAMUSCULAR | Status: AC
Start: 2021-09-03 — End: 2021-09-03
  Administered 2021-09-03: 4 mg via INTRAVENOUS
  Filled 2021-09-03: qty 2

## 2021-09-03 MED ORDER — ONDANSETRON HCL 4 MG/2ML IJ SOLN: 4.0000 mg | Freq: Once | INTRAMUSCULAR | Status: AC

## 2021-09-03 MED ORDER — ONDANSETRON 4 MG PO TBDP: 4.0000 mg | ORAL_TABLET | Freq: Once | ORAL | Status: DC

## 2021-09-03 MED ORDER — HYDROMORPHONE HCL 1 MG/ML IJ SOLN
0.5000 mg | Freq: Once | INTRAMUSCULAR | Status: AC
  Administered 2021-09-03: 0.5 mg via INTRAVENOUS
  Filled 2021-09-03: qty 0.5

## 2021-09-03 MED ORDER — ONDANSETRON HCL 4 MG/2ML IJ SOLN
4.0000 mg | Freq: Once | INTRAMUSCULAR | Status: AC
  Administered 2021-09-03: 4 mg via INTRAVENOUS
  Filled 2021-09-03: qty 2

## 2021-09-03 MED ORDER — KETOROLAC TROMETHAMINE 30 MG/ML IJ SOLN
30.0000 mg | Freq: Once | INTRAMUSCULAR | Status: AC
  Administered 2021-09-03: 30 mg via INTRAVENOUS
  Filled 2021-09-03: qty 1

## 2021-09-03 MED ORDER — OXYCODONE-ACETAMINOPHEN 5-325 MG PO TABS: 1.0000 | ORAL_TABLET | Freq: Four times a day (QID) | ORAL | 0 refills | Status: DC | PRN

## 2021-09-03 MED ORDER — ONDANSETRON 4 MG PO TBDP: ORAL_TABLET | ORAL | 0 refills | Status: DC

## 2021-09-03 MED ORDER — TAMSULOSIN HCL 0.4 MG PO CAPS: 0.4000 mg | ORAL_CAPSULE | Freq: Every day | ORAL | 0 refills | Status: DC

## 2021-09-03 NOTE — ED Notes (Signed)
Pt transported to CT ?

## 2021-09-03 NOTE — Discharge Instructions (Signed)
Follow-up with Dr. Pete Glatter urologist here in [place].  Call today for appointment this week.  Return if problem ?

## 2021-09-03 NOTE — ED Provider Notes (Signed)
?[facility] ?Provider Note ? ? ?CSN: [id number] ?Arrival date & time: 09/03/21  [id number] ? ?  ? ?History ? ?Chief Complaint  ?Patient presents with  ? Flank Pain  ? ? ?Kent Goodwin is a 41 y.o. male. ? ?Patient complains of flank pain.  Patient has a history of kidney stones.  Patient states he started with severe pain today with nausea ? ?The history is provided by the patient and medical records. No language interpreter was used.  ?Flank Pain ?This is a new problem. The current episode started 1 to 2 hours ago. The problem occurs constantly. The problem has not changed since onset.Pertinent negatives include no chest pain, no abdominal pain and no headaches. Nothing aggravates the symptoms. Nothing relieves the symptoms. He has tried nothing for the symptoms. The treatment provided no relief.  ? ?  ? ?Home Medications ?Prior to Admission medications   ?Medication Sig Start Date End Date Taking? Authorizing Provider  ?ondansetron (ZOFRAN-ODT) 4 MG disintegrating tablet 4mg  ODT q4 hours prn nausea/vomit 09/03/21  Yes Milton Ferguson, MD  ?oxyCODONE-acetaminophen (PERCOCET) 5-325 MG tablet Take 1 tablet by mouth every 6 (six) hours as needed. 09/03/21  Yes Milton Ferguson, MD  ?tamsulosin (FLOMAX) 0.4 MG CAPS capsule Take 1 capsule (0.4 mg total) by mouth daily. 09/03/21  Yes Milton Ferguson, MD  ?cyclobenzaprine (FLEXERIL) 10 MG tablet Take 1 tablet (10 mg total) by mouth 2 (two) times daily as needed for muscle spasms. 10/21/20   Marcello Fennel, PA-C  ?HYDROcodone-acetaminophen (NORCO/VICODIN) 5-325 MG tablet Take 1 tablet by mouth every 4 (four) hours as needed for moderate pain. 08/08/17   Jola Schmidt, MD  ?   ? ?Allergies    ?Patient has no known allergies.   ? ?Review of Systems   ?Review of Systems  ?Constitutional:  Negative for appetite change and fatigue.  ?HENT:  Negative for congestion, ear discharge and sinus pressure.   ?Eyes:  Negative for discharge.  ?Respiratory:  Negative for cough.    ?Cardiovascular:  Negative for chest pain.  ?Gastrointestinal:  Negative for abdominal pain and diarrhea.  ?Genitourinary:  Positive for flank pain. Negative for frequency and hematuria.  ?Musculoskeletal:  Negative for back pain.  ?Skin:  Negative for rash.  ?Neurological:  Negative for seizures and headaches.  ?Psychiatric/Behavioral:  Negative for hallucinations.   ? ?Physical Exam ?Updated Vital Signs ?BP (!) 137/95   Pulse 80   Temp 97.6 ?F (36.4 ?C) (Oral)   Resp 18   Ht 6\' 3"  (1.905 m)   Wt 103.4 kg   SpO2 97%   BMI 28.50 kg/m?  ?Physical Exam ?Vitals and nursing note reviewed.  ?Constitutional:   ?   Appearance: He is well-developed. He is ill-appearing.  ?HENT:  ?   Head: Normocephalic.  ?   Nose: Nose normal.  ?Eyes:  ?   General: No scleral icterus. ?   Conjunctiva/sclera: Conjunctivae normal.  ?Neck:  ?   Thyroid: No thyromegaly.  ?Cardiovascular:  ?   Rate and Rhythm: Normal rate and regular rhythm.  ?   Heart sounds: No murmur heard. ?  No friction rub. No gallop.  ?Pulmonary:  ?   Breath sounds: No stridor. No wheezing or rales.  ?Chest:  ?   Chest wall: No tenderness.  ?Abdominal:  ?   General: There is no distension.  ?   Tenderness: There is no abdominal tenderness. There is no rebound.  ?Musculoskeletal:  ?   Cervical back: Neck supple.  ?  Comments: Tender right flank  ?Lymphadenopathy:  ?   Cervical: No cervical adenopathy.  ?Skin: ?   Findings: No erythema or rash.  ?Neurological:  ?   Mental Status: He is oriented to person, place, and time.  ?   Motor: No abnormal muscle tone.  ?   Coordination: Coordination normal.  ?Psychiatric:     ?   Behavior: Behavior normal.  ? ? ?ED Results / Procedures / Treatments   ?Labs ?(all labs ordered are listed, but only abnormal results are displayed) ?Labs Reviewed  ?CBC WITH DIFFERENTIAL/PLATELET - Abnormal; Notable for the following components:  ?    Result Value  ? Lymphs Abs 4.7 (*)   ? All other components within normal limits  ?BASIC  METABOLIC PANEL - Abnormal; Notable for the following components:  ? Glucose, Bld 138 (*)   ? All other components within normal limits  ?URINALYSIS, ROUTINE W REFLEX MICROSCOPIC - Abnormal; Notable for the following components:  ? APPearance TURBID (*)   ? Hgb urine dipstick LARGE (*)   ? WBC, UA >50 (*)   ? Bacteria, UA RARE (*)   ? All other components within normal limits  ?URINE CULTURE  ? ? ?EKG ?None ? ?Radiology ?CT Renal Stone Study ? ?Result Date: 09/03/2021 ?CLINICAL DATA:  Right flank pain since 2:30 a.m. EXAM: CT ABDOMEN AND PELVIS WITHOUT CONTRAST TECHNIQUE: Multidetector CT imaging of the abdomen and pelvis was performed following the standard protocol without IV contrast. RADIATION DOSE REDUCTION: This exam was performed according to the departmental dose-optimization program which includes automated exposure control, adjustment of the mA and/or kV according to patient size and/or use of iterative reconstruction technique. COMPARISON:  07/19/2017 FINDINGS: Lower chest: 4 mm calcified nodule in the right posterior costophrenic sulcus likely result of prior granulomatous inflammation. Lung bases otherwise clear. Hepatobiliary: No focal liver abnormality is seen. No gallstones, gallbladder wall thickening, or biliary dilatation. Pancreas: Unremarkable. No pancreatic ductal dilatation or surrounding inflammatory changes. Spleen: Normal in size without focal abnormality. Adrenals/Urinary Tract: Adrenal glands are normal. 3 nonobstructing right renal calculi are present with the largest measuring 5 mm. There is minimal right hydronephrosis and hydroureter due to 4 mm obstructing calculus in the proximal ureter. 7 mm nonobstructing calculus present in the lower pole of the left kidney. Stomach/Bowel: Stomach is within normal limits. Appendix appears normal. No evidence of bowel wall thickening, distention, or inflammatory changes. Vascular/Lymphatic: No significant vascular findings are present. No enlarged  abdominal or pelvic lymph nodes. Reproductive: Prostate is unremarkable. Other: No abdominal wall hernia or abnormality. No abdominopelvic ascites. Musculoskeletal: 5.6 x 2.6 cm lipomatous lesion partially visualized underlying the left latissimus dorsi muscle. No acute osseous abnormality. Posterior fusion changes seen at L5-S1. The right S1 screw is fractured, unchanged from prior examination. IMPRESSION: 1. Minimal right hydronephrosis due to 4 mm obstructing calculus in the right proximal ureter. 2. Bilateral nonobstructing calculi. Electronically Signed   By: Acquanetta BellingFarhaan  Mir M.D.   On: 09/03/2021 09:03   ? ?Procedures ?Procedures  ? ? ?Medications Ordered in ED ?Medications  ?cefTRIAXone (ROCEPHIN) 2 g in sodium chloride 0.9 % 100 mL IVPB (2 g Intravenous New Bag/Given 09/03/21 1109)  ?ondansetron [facility](ZOFRAN) injection 4 mg (4 mg Intravenous Given 09/03/21 0819)  ?HYDROmorphone (DILAUDID) injection 0.5 mg (0.5 mg Intravenous Given 09/03/21 0820)  ?ketorolac (TORADOL) 30 MG/ML injection 30 mg (30 mg Intravenous Given 09/03/21 0819)  ?HYDROmorphone (DILAUDID) injection 1 mg (1 mg Intravenous Given 09/03/21 0926)  ?ondansetron (ZOFRAN) injection 4  mg (4 mg Intravenous Given 09/03/21 0925)  ? ? ?ED Course/ Medical Decision Making/ A&P ?  ?                        ?Medical Decision Making ?Amount and/or Complexity of Data Reviewed ?Labs: ordered. ?Radiology: ordered. ? ?Risk ?Prescription drug management. ? ?This patient presents to the ED for concern of flank pain, this involves an extensive number of treatment options, and is a complaint that carries with it a high risk of complications and morbidity.  The differential diagnosis includes UTI, kidney stone, muscle pain ? ? ?Co morbidities that complicate the patient evaluation ? ?History of kidney stones ? ? ?Additional history obtained: ? ?Additional history obtained from patient ?External records from outside source obtained and reviewed including hospital record ? ? ?Lab  Tests: ? ?I Ordered, and personally interpreted labs.  The pertinent results include: CBC and chemistries which were unremarkable except for elevated glucose at 138 ? ? ?Imaging Studies ordered: ? ?I ordered imaging studies inc

## 2021-09-03 NOTE — Telephone Encounter (Signed)
Called patient to  schedule ER follow up appt. Patient did not answer so I left a message to contact the office for an appt.  ?

## 2021-09-03 NOTE — ED Triage Notes (Signed)
Pt arrived to triage complaining right sided flank pain that started around 2am this morning. Pt states hx of kidney stone states this feels similar to that.  ? ?Pt vomited this morning and is still feeling nauseous.  ?

## 2021-09-04 LAB — URINE CULTURE: Culture: NO GROWTH

## 2022-05-12 DIAGNOSIS — Z0000 Encounter for general adult medical examination without abnormal findings: Secondary | ICD-10-CM | POA: Diagnosis not present

## 2022-05-12 DIAGNOSIS — Z0001 Encounter for general adult medical examination with abnormal findings: Secondary | ICD-10-CM | POA: Diagnosis not present

## 2022-05-12 DIAGNOSIS — Z1331 Encounter for screening for depression: Secondary | ICD-10-CM | POA: Diagnosis not present

## 2022-05-12 DIAGNOSIS — E6609 Other obesity due to excess calories: Secondary | ICD-10-CM | POA: Diagnosis not present

## 2022-05-12 DIAGNOSIS — Z6829 Body mass index (BMI) 29.0-29.9, adult: Secondary | ICD-10-CM | POA: Diagnosis not present

## 2022-05-12 DIAGNOSIS — E063 Autoimmune thyroiditis: Secondary | ICD-10-CM | POA: Diagnosis not present

## 2022-05-12 DIAGNOSIS — I10 Essential (primary) hypertension: Secondary | ICD-10-CM | POA: Diagnosis not present

## 2022-05-19 ENCOUNTER — Other Ambulatory Visit (HOSPITAL_COMMUNITY): Payer: Self-pay | Admitting: Internal Medicine

## 2022-05-19 DIAGNOSIS — R7989 Other specified abnormal findings of blood chemistry: Secondary | ICD-10-CM

## 2022-05-30 ENCOUNTER — Ambulatory Visit (HOSPITAL_COMMUNITY)
Admission: RE | Admit: 2022-05-30 | Discharge: 2022-05-30 | Disposition: A | Discharge status: Home / Self Care | Payer: 59 | Source: Ambulatory Visit | Attending: Internal Medicine | Admitting: Internal Medicine

## 2022-05-30 DIAGNOSIS — R7989 Other specified abnormal findings of blood chemistry: Secondary | ICD-10-CM | POA: Diagnosis not present

## 2022-05-30 DIAGNOSIS — N200 Calculus of kidney: Secondary | ICD-10-CM | POA: Diagnosis not present

## 2023-02-03 DIAGNOSIS — E663 Overweight: Secondary | ICD-10-CM | POA: Diagnosis not present

## 2023-02-03 DIAGNOSIS — I10 Essential (primary) hypertension: Secondary | ICD-10-CM | POA: Diagnosis not present

## 2023-02-03 DIAGNOSIS — J0100 Acute maxillary sinusitis, unspecified: Secondary | ICD-10-CM | POA: Diagnosis not present

## 2023-02-03 DIAGNOSIS — Z6829 Body mass index (BMI) 29.0-29.9, adult: Secondary | ICD-10-CM | POA: Diagnosis not present

## 2023-02-03 DIAGNOSIS — Z20828 Contact with and (suspected) exposure to other viral communicable diseases: Secondary | ICD-10-CM | POA: Diagnosis not present

## 2023-02-03 DIAGNOSIS — R6889 Other general symptoms and signs: Secondary | ICD-10-CM | POA: Diagnosis not present

## 2023-02-03 DIAGNOSIS — J209 Acute bronchitis, unspecified: Secondary | ICD-10-CM | POA: Diagnosis not present

## 2023-07-08 ENCOUNTER — Ambulatory Visit
Admission: RE | Admit: 2023-07-08 | Discharge: 2023-07-08 | Disposition: A | Discharge status: Home / Self Care | Payer: Self-pay | Source: Ambulatory Visit

## 2023-07-08 ENCOUNTER — Ambulatory Visit (INDEPENDENT_AMBULATORY_CARE_PROVIDER_SITE_OTHER)

## 2023-07-08 VITALS — BP 144/83 | HR 85 | Temp 98.3°F | Resp 18

## 2023-07-08 DIAGNOSIS — M25552 Pain in left hip: Secondary | ICD-10-CM

## 2023-07-08 MED ORDER — CYCLOBENZAPRINE HCL 10 MG PO TABS: 10.0000 mg | ORAL_TABLET | Freq: Two times a day (BID) | ORAL | 0 refills | Status: DC | PRN

## 2023-07-08 MED ORDER — PREDNISONE 20 MG PO TABS: 40.0000 mg | ORAL_TABLET | Freq: Every day | ORAL | 0 refills | Status: AC

## 2023-07-08 MED ORDER — KETOROLAC TROMETHAMINE 60 MG/2ML IM SOLN
60.0000 mg | Freq: Once | INTRAMUSCULAR | Status: AC
  Administered 2023-07-08: 60 mg via INTRAMUSCULAR

## 2023-07-08 NOTE — Discharge Instructions (Addendum)
 The x-ray of your left hip is pending.  You will be contacted when the results of the x-ray are received.  You also have access to your results via MyChart. You were given an injection of Toradol 60 mg.  Do not take any additional NSAIDs such as Aleve, Advil, ibuprofen, or Motrin today.  Continue over-the-counter Tylenol for breakthrough pain or discomfort. May take over-the-counter Tylenol for pain or discomfort. RICE therapy rest, ice, compression, and elevation until your symptoms improve.  Apply ice for 20 minutes, remove for 1 hour, then repeat as often as possible.  This will help with pain and swelling. I provided some hip exercises for you to perform at home.  Try to perform the exercises at least 2-3 times daily while symptoms persist. As discussed, if symptoms fail to improve, or appear to be worsening, I would like for you to follow-up with orthopedics for reevaluation. Follow-up as needed.

## 2023-07-08 NOTE — ED Triage Notes (Signed)
 Pt reports left side hip pain x 2 weeks denies injury. Can feel pain in the left thigh knee and buttock.

## 2023-07-08 NOTE — ED Provider Notes (Signed)
 [facility]    CSN: [id number] Arrival date & time: 07/08/23  1727      History   Chief Complaint Chief Complaint  Patient presents with   Hip Pain    Entered by patient    HPI Kent Goodwin is a 43 y.o. male.   The history is provided by the patient.   Patient presents for complaints of left hip pain is been present for the past 2 weeks.  Patient denies injury or trauma.  States pain worsens when he is going from a sitting to a standing position, and when he lies on his left side when he is asleep.  Patient states he is now experiencing some pain in the left thigh, knee, and buttock.  States that the pain in the left hip has caused him to have a change in his gait.  Denies injury, trauma, numbness, tingling, or weakness in the left lower extremity.  Patient states he has been taking over-the-counter Tylenol for pain with minimal relief.  Patient with prior history of back pain, states his back pain has not changed since the hip pain started.  Further denies urinary frequency, urgency, hesitancy, or hematuria.  Past Medical History:  Diagnosis Date   Complication of anesthesia    SLOW TO WAKE   Gross hematuria    History of kidney stones    Left ureteral stone    Renal calculus, left    Urgency of urination     Patient Active Problem List   Diagnosis Date Noted   NEPHROLITHIASIS 09/05/2008   CHEST PAIN, ACUTE 08/29/2008   SMOKELESS TOBACCO ABUSE 03/23/2008   BACK PAIN, LUMBAR, CHRONIC 03/23/2008    Past Surgical History:  Procedure Laterality Date   CYSTO/  LEFT RETROGRADE PYELOGRAM/  LEFT URETERAL STENT PLACEMENT  01/14/2011   CYSTO/  URETHRAL DILATION/  RIGHT URETEROSCOPIC STONE EXTRACTION  02/16/2006   CYSTOSCOPY/URETEROSCOPY/HOLMIUM LASER/STENT PLACEMENT Left 05/21/2016   Procedure: CYSTOSCOPY/ RETROGRADE PYELOGRAM/ URETEROSCOPY/HOLMIUM LASER/STENT PLACEMENT;  Surgeon: Sebastian Ache, MD;  Location: [facility];  Service: Urology;   Laterality: Left;   LUMBAR FUSION  2006   L5 -- S1       Home Medications    Prior to Admission medications   Medication Sig Start Date End Date Taking? Authorizing Provider  cyclobenzaprine (FLEXERIL) 10 MG tablet Take 1 tablet (10 mg total) by mouth 2 (two) times daily as needed for muscle spasms. 07/08/23  Yes Leath-Warren, Sadie Haber, NP  levothyroxine (SYNTHROID) 100 MCG tablet Take 100 mcg by mouth daily. 06/26/23  Yes [provider]  losartan-hydrochlorothiazide (HYZAAR) 100-12.5 MG tablet Take 1 tablet by mouth daily. 06/28/23  Yes [provider]  predniSONE (DELTASONE) 20 MG tablet Take 2 tablets (40 mg total) by mouth daily with breakfast for 5 days. 07/08/23 07/13/23 Yes Leath-Warren, Sadie Haber, NP  HYDROcodone-acetaminophen (NORCO/VICODIN) 5-325 MG tablet Take 1 tablet by mouth every 4 (four) hours as needed for moderate pain. 08/08/17   Azalia Bilis, MD  ondansetron (ZOFRAN-ODT) 4 MG disintegrating tablet 4mg  ODT q4 hours prn nausea/vomit 09/03/21   Bethann Berkshire, MD  oxyCODONE-acetaminophen (PERCOCET) 5-325 MG tablet Take 1 tablet by mouth every 6 (six) hours as needed. 09/03/21   Bethann Berkshire, MD  tamsulosin (FLOMAX) 0.4 MG CAPS capsule Take 1 capsule (0.4 mg total) by mouth daily. 09/03/21   Bethann Berkshire, MD    Family History History reviewed. No pertinent family history.  Social History Social History   Tobacco Use  Smoking status: Former    Current packs/day: 0.00    Average packs/day: 0.3 packs/day for 13.0 years (3.3 ttl pk-yrs)    Types: Cigarettes    Start date: 05/16/2001    Quit date: 05/16/2014    Years since quitting: 9.1   Smokeless tobacco: Current    Types: Snuff  Substance Use Topics   Alcohol use: No   Drug use: No     Allergies   Patient has no known allergies.   Review of Systems Review of Systems Per HPI  Physical Exam Triage Vital Signs ED Triage Vitals  Encounter Vitals Group     BP 07/08/23 1755 (!) 144/83      Systolic BP Percentile --      Diastolic BP Percentile --      Pulse Rate 07/08/23 1755 85     Resp 07/08/23 1755 18     Temp 07/08/23 1755 98.3 F (36.8 C)     Temp Source 07/08/23 1755 Oral     SpO2 07/08/23 1755 97 %     Weight --      Height --      Head Circumference --      Peak Flow --      Pain Score 07/08/23 1758 8     Pain Loc --      Pain Education --      Exclude from Growth Chart --    No data found.  Updated Vital Signs BP (!) 144/83 (BP Location: Right Arm)   Pulse 85   Temp 98.3 F (36.8 C) (Oral)   Resp 18   SpO2 97%   Visual Acuity Right Eye Distance:   Left Eye Distance:   Bilateral Distance:    Right Eye Near:   Left Eye Near:    Bilateral Near:     Physical Exam Vitals and nursing note reviewed.  Constitutional:      General: He is not in acute distress.    Appearance: Normal appearance.  HENT:     Head: Normocephalic.  Pulmonary:     Effort: Pulmonary effort is normal.  Abdominal:     Palpations: Abdomen is soft.  Musculoskeletal:     Cervical back: Normal range of motion.     Left hip: No deformity or tenderness. Decreased range of motion. Normal strength.     Left upper leg: Normal.  Skin:    General: Skin is warm and dry.  Neurological:     General: No focal deficit present.     Mental Status: He is alert and oriented to person, place, and time.  Psychiatric:        Mood and Affect: Mood normal.        Behavior: Behavior normal.      UC Treatments / Results  Labs (all labs ordered are listed, but only abnormal results are displayed) Labs Reviewed - No data to display  EKG   Radiology No results found.  Procedures Procedures (including critical care time)  Medications Ordered in UC Medications  ketorolac (TORADOL) injection 60 mg (has no administration in time range)    Initial Impression / Assessment and Plan / UC Course  I have reviewed the triage vital signs and the nursing notes.  Pertinent labs & imaging  results that were available during my care of the patient were reviewed by me and considered in my medical decision making (see chart for details).  X-ray of the left hip is pending.  Differential diagnoses include bursitis,  versus arthritis.  Toradol 60 mg IM administered.  Will start patient on prednisone 40 mg for the next 5 days, along with cyclobenzaprine 10 mg.  Supportive care recommendations were provided and discussed with the patient to include continuing over-the-counter Tylenol, the use of ice or heat, and gentle range of motion exercises.  Patient was advised that if symptoms fail to improve, it is recommended that he follow-up with orthopedics for reevaluation.  Patient was in agreement with this plan of care and verbalized understanding.  All questions were answered.  Patient stable for discharge.  Final Clinical Impressions(s) / UC Diagnoses   Final diagnoses:  Left hip pain     Discharge Instructions      The x-ray of your left hip is pending.  You will be contacted when the results of the x-ray are received.  You also have access to your results via MyChart. You were given an injection of Toradol 60 mg.  Do not take any additional NSAIDs such as Aleve, Advil, ibuprofen, or Motrin today.  Continue over-the-counter Tylenol for breakthrough pain or discomfort. May take over-the-counter Tylenol for pain or discomfort. RICE therapy rest, ice, compression, and elevation until your symptoms improve.  Apply ice for 20 minutes, remove for 1 hour, then repeat as often as possible.  This will help with pain and swelling. I provided some hip exercises for you to perform at home.  Try to perform the exercises at least 2-3 times daily while symptoms persist. As discussed, if symptoms fail to improve, or appear to be worsening, I would like for you to follow-up with orthopedics for reevaluation. Follow-up as needed.      ED Prescriptions     Medication Sig Dispense Auth. Provider    predniSONE (DELTASONE) 20 MG tablet Take 2 tablets (40 mg total) by mouth daily with breakfast for 5 days. 10 tablet Leath-Warren, Sadie Haber, NP   cyclobenzaprine (FLEXERIL) 10 MG tablet Take 1 tablet (10 mg total) by mouth 2 (two) times daily as needed for muscle spasms. 20 tablet Leath-Warren, Sadie Haber, NP      PDMP not reviewed this encounter.   Abran Cantor, NP 07/08/23 Paulo Fruit

## 2023-07-22 ENCOUNTER — Ambulatory Visit (INDEPENDENT_AMBULATORY_CARE_PROVIDER_SITE_OTHER): Admitting: Orthopedic Surgery

## 2023-07-22 ENCOUNTER — Other Ambulatory Visit (INDEPENDENT_AMBULATORY_CARE_PROVIDER_SITE_OTHER): Payer: Self-pay

## 2023-07-22 ENCOUNTER — Encounter: Payer: Self-pay | Admitting: Orthopedic Surgery

## 2023-07-22 VITALS — Ht 75.0 in | Wt 230.0 lb

## 2023-07-22 DIAGNOSIS — T84216A Breakdown (mechanical) of internal fixation device of vertebrae, initial encounter: Secondary | ICD-10-CM

## 2023-07-22 DIAGNOSIS — M7062 Trochanteric bursitis, left hip: Secondary | ICD-10-CM | POA: Diagnosis not present

## 2023-07-22 DIAGNOSIS — M5442 Lumbago with sciatica, left side: Secondary | ICD-10-CM

## 2023-07-22 MED ORDER — TIZANIDINE HCL 4 MG PO TABS: 4.0000 mg | ORAL_TABLET | Freq: Three times a day (TID) | ORAL | 0 refills | Status: DC | PRN

## 2023-07-22 NOTE — Patient Instructions (Signed)

## 2023-07-23 ENCOUNTER — Encounter: Payer: Self-pay | Admitting: Orthopedic Surgery

## 2023-07-23 NOTE — Progress Notes (Signed)
 New Patient Visit  Assessment: Kent Goodwin is a 43 y.o. male with the following: 1. Greater trochanteric bursitis of left hip  Plan: Kent Goodwin has pain in left lateral hip and buttock.  Tenderness over the greater trochanter.  He has a history of L5-S1 fusion and does not feel like the current pain is nerve pain.  Will treat greater trochanteric bursitis with an injection and tizanidine.  Follow up as needed.   Procedure note injection - Left lateral hip   Verbal consent was obtained to inject the Left lateral hip.  Patient localized the pain. Timeout was completed to confirm the site of injection.  The skin was prepped with alcohol and ethyl chloride was sprayed at the injection site.  A 21-gauge needle was used to inject 40 mg of Depo-Medrol and 1% lidocaine (4 cc) into the Left lateral hip, directly over the localized tenderness using a direct lateral approach.  There were no complications. A sterile bandage was applied.   Follow-up: No follow-ups on file.  Subjective:  Chief Complaint  Patient presents with   Back Pain    Back pain with radiating pain down left leg.    History of Present Illness: Kent Goodwin is a 43 y.o. male who has been referred by  Devra Dopp NP for evaluation of left hip pain.  He has had pain in his left buttock and lateral hip for a 1-2 months.  No specific injury.  He has a history of L5-S1 fusion completed for trauma 15+ years ago.  He is not complaining of back pain.  He has some pain radiating to the medial right knee.  Short course of prednisone was not helpful.  Flexeril has not been helping.  He states that he has a broken screw, and is aware.   Review of Systems: No fevers or chills No numbness or tingling No chest pain No shortness of breath No bowel or bladder dysfunction No GI distress No headaches   Medical History:  Past Medical History:  Diagnosis Date   Complication of anesthesia    SLOW TO WAKE   Gross  hematuria    History of kidney stones    Left ureteral stone    Renal calculus, left    Urgency of urination     Past Surgical History:  Procedure Laterality Date   CYSTO/  LEFT RETROGRADE PYELOGRAM/  LEFT URETERAL STENT PLACEMENT  01/14/2011   CYSTO/  URETHRAL DILATION/  RIGHT URETEROSCOPIC STONE EXTRACTION  02/16/2006   CYSTOSCOPY/URETEROSCOPY/HOLMIUM LASER/STENT PLACEMENT Left 05/21/2016   Procedure: CYSTOSCOPY/ RETROGRADE PYELOGRAM/ URETEROSCOPY/HOLMIUM LASER/STENT PLACEMENT;  Surgeon: Sebastian Ache, MD;  Location: [facility];  Service: Urology;  Laterality: Left;   LUMBAR FUSION  2006   L5 -- S1    No family history on file. Social History   Tobacco Use   Smoking status: Former    Current packs/day: 0.00    Average packs/day: 0.3 packs/day for 13.0 years (3.3 ttl pk-yrs)    Types: Cigarettes    Start date: 05/16/2001    Quit date: 05/16/2014    Years since quitting: 9.1   Smokeless tobacco: Current    Types: Snuff  Substance Use Topics   Alcohol use: No   Drug use: No    No Known Allergies  Current Meds  Medication Sig   tiZANidine (ZANAFLEX) 4 MG tablet Take 1 tablet (4 mg total) by mouth every 8 (eight) hours as needed for muscle spasms.   HYDROcodone-acetaminophen (NORCO/VICODIN)  5-325 MG tablet Take 1 tablet by mouth every 4 (four) hours as needed for moderate pain.   levothyroxine (SYNTHROID) 100 MCG tablet Take 100 mcg by mouth daily.   losartan-hydrochlorothiazide (HYZAAR) 100-12.5 MG tablet Take 1 tablet by mouth daily.   ondansetron (ZOFRAN-ODT) 4 MG disintegrating tablet 4mg  ODT q4 hours prn nausea/vomit   oxyCODONE-acetaminophen (PERCOCET) 5-325 MG tablet Take 1 tablet by mouth every 6 (six) hours as needed.   tamsulosin (FLOMAX) 0.4 MG CAPS capsule Take 1 capsule (0.4 mg total) by mouth daily.    Objective: Ht 6\' 3"  (1.905 m)   Wt 230 lb (104.3 kg)   BMI 28.75 kg/m   Physical Exam:  General: Alert and oriented. and No acute  distress. Gait: Normal gait.  No tenderness to low back.  Negative straight leg raise.  Good LE strength.  Sensation intact.  Tenderness over the greater trochanter laterally.  Tenderness within the buttock.  Unable to lay on left side.   IMAGING: I personally ordered and reviewed the following images  Standing lumber spine XR were obtained in clinic today.  No acute injury.  No anterolisthesis.  Prior fusion at L5-S1.  Single screw is broken.  Minimal degenerative changes.  No bony lesions.  Impression: Lumbar spine x-rays with stable fusion, with broken screw.   New Medications:  Meds ordered this encounter  Medications   tiZANidine (ZANAFLEX) 4 MG tablet    Sig: Take 1 tablet (4 mg total) by mouth every 8 (eight) hours as needed for muscle spasms.    Dispense:  30 tablet    Refill:  0      Oliver Barre, MD  07/23/2023 10:00 PM

## 2023-08-11 ENCOUNTER — Ambulatory Visit
Admission: EM | Admit: 2023-08-11 | Discharge: 2023-08-11 | Disposition: A | Discharge status: Home / Self Care | Attending: Nurse Practitioner | Admitting: Nurse Practitioner

## 2023-08-11 ENCOUNTER — Encounter: Payer: Self-pay | Admitting: Emergency Medicine

## 2023-08-11 DIAGNOSIS — M5442 Lumbago with sciatica, left side: Secondary | ICD-10-CM | POA: Diagnosis not present

## 2023-08-11 MED ORDER — KETOROLAC TROMETHAMINE 30 MG/ML IJ SOLN
30.0000 mg | Freq: Once | INTRAMUSCULAR | Status: AC
  Administered 2023-08-11: 30 mg via INTRAMUSCULAR

## 2023-08-11 MED ORDER — DICLOFENAC SODIUM 25 MG PO TBEC: 25.0000 mg | DELAYED_RELEASE_TABLET | Freq: Two times a day (BID) | ORAL | 0 refills | Status: AC

## 2023-08-11 NOTE — Discharge Instructions (Addendum)
 The pain you are having in the left side is consistent with sciatica.  We have given you an injection of a nonsteroidal anti-inflammatory medicine called Toradol today.  You can start taking the diclofenac pills tomorrow twice daily with food to help with inflammation.  Continue muscle relaxant at nighttime as Tylenol as needed during the day.  Recommend close follow-up with sports medicine.

## 2023-08-11 NOTE — ED Triage Notes (Signed)
 Lower back pain over the past several days.  Pain and numbness running down left leg.  Pain worse when walking.  Was seen by ortho care and was given a steroid shot that did not help last month.

## 2023-08-12 ENCOUNTER — Ambulatory Visit: Payer: Self-pay

## 2023-08-12 NOTE — ED Provider Notes (Signed)
 [facility]    CSN: [id number] Arrival date & time: 08/11/23  1746      History   Chief Complaint No chief complaint on file.   HPI Kent Goodwin is a 43 y.o. male.   Patient presents today with ongoing left hip pain.  He was initially seen for same symptoms approximately 1 month ago, was treated with oral prednisone and an injection of NSAID without much improvement.  Approximately 2 weeks after that, he was seen by an orthopedic provider, was treated with hip injection and he reports symptoms have not improved.  He was also prescribed a muscle relaxant at that time which he reports makes him sleep but does not help with the pain.  He denies recent fall, trauma, or known injury to the hip or back.  No new bowel or bladder incontinence, excessive weakness of lower extremities, or decreased sensation of lower extremities.  Reports the pain makes it feel like his leg is getting weaker and he does occasionally have pain shooting down his left hip to his foot.  No current numbness or tingling in the toes.  No bruising, swelling, or redness.    Past Medical History:  Diagnosis Date   Complication of anesthesia    SLOW TO WAKE   Gross hematuria    History of kidney stones    Left ureteral stone    Renal calculus, left    Urgency of urination     Patient Active Problem List   Diagnosis Date Noted   NEPHROLITHIASIS 09/05/2008   CHEST PAIN, ACUTE 08/29/2008   SMOKELESS TOBACCO ABUSE 03/23/2008   BACK PAIN, LUMBAR, CHRONIC 03/23/2008    Past Surgical History:  Procedure Laterality Date   CYSTO/  LEFT RETROGRADE PYELOGRAM/  LEFT URETERAL STENT PLACEMENT  01/14/2011   CYSTO/  URETHRAL DILATION/  RIGHT URETEROSCOPIC STONE EXTRACTION  02/16/2006   CYSTOSCOPY/URETEROSCOPY/HOLMIUM LASER/STENT PLACEMENT Left 05/21/2016   Procedure: CYSTOSCOPY/ RETROGRADE PYELOGRAM/ URETEROSCOPY/HOLMIUM LASER/STENT PLACEMENT;  Surgeon: Sebastian Ache, MD;  Location: [facility];   Service: Urology;  Laterality: Left;   LUMBAR FUSION  2006   L5 -- S1       Home Medications    Prior to Admission medications   Medication Sig Start Date End Date Taking? Authorizing Provider  diclofenac (VOLTAREN) 25 MG EC tablet Take 1 tablet (25 mg total) by mouth 2 (two) times daily for 14 days. Take with food 08/11/23 08/25/23 Yes Cathlean Marseilles A, NP  levothyroxine (SYNTHROID) 100 MCG tablet Take 100 mcg by mouth daily. 06/26/23   [provider]  losartan-hydrochlorothiazide (HYZAAR) 100-12.5 MG tablet Take 1 tablet by mouth daily. 06/28/23   [provider]  tamsulosin (FLOMAX) 0.4 MG CAPS capsule Take 1 capsule (0.4 mg total) by mouth daily. 09/03/21   Bethann Berkshire, MD  tiZANidine (ZANAFLEX) 4 MG tablet Take 1 tablet (4 mg total) by mouth every 8 (eight) hours as needed for muscle spasms. 07/22/23   Oliver Barre, MD    Family History History reviewed. No pertinent family history.  Social History Social History   Tobacco Use   Smoking status: Former    Current packs/day: 0.00    Average packs/day: 0.3 packs/day for 13.0 years (3.3 ttl pk-yrs)    Types: Cigarettes    Start date: 05/16/2001    Quit date: 05/16/2014    Years since quitting: 9.2   Smokeless tobacco: Current    Types: Snuff  Substance Use Topics   Alcohol use: No  Drug use: No     Allergies   Patient has no known allergies.   Review of Systems Review of Systems Per HPI  Physical Exam Triage Vital Signs ED Triage Vitals  Encounter Vitals Group     BP 08/11/23 1832 138/82     Systolic BP Percentile --      Diastolic BP Percentile --      Pulse Rate 08/11/23 1832 85     Resp 08/11/23 1832 18     Temp 08/11/23 1832 97.9 F (36.6 C)     Temp Source 08/11/23 1832 Oral     SpO2 08/11/23 1832 97 %     Weight --      Height --      Head Circumference --      Peak Flow --      Pain Score 08/11/23 1833 10     Pain Loc --      Pain Education --      Exclude from Growth  Chart --    No data found.  Updated Vital Signs BP 138/82 (BP Location: Right Arm)   Pulse 85   Temp 97.9 F (36.6 C) (Oral)   Resp 18   SpO2 97%   Visual Acuity Right Eye Distance:   Left Eye Distance:   Bilateral Distance:    Right Eye Near:   Left Eye Near:    Bilateral Near:     Physical Exam Vitals and nursing note reviewed.  Constitutional:      General: He is not in acute distress.    Appearance: Normal appearance. He is not toxic-appearing.  Pulmonary:     Effort: Pulmonary effort is normal. No respiratory distress.  Musculoskeletal:     Right lower leg: No edema.     Left lower leg: No edema.       Legs:     Comments: Inspection: no swelling, bruising, obvious deformity or redness Palpation: tender to palpation SI joint; no obvious deformities palpated ROM: Difficult to assess secondary to pain Strength: 5/5 bilateral lower extremities Neurovascular: neurovascularly intact in distal bilateral lower extremities  Skin:    General: Skin is warm and dry.     Capillary Refill: Capillary refill takes less than 2 seconds.     Coloration: Skin is not jaundiced or pale.     Findings: No erythema.  Neurological:     Mental Status: He is alert and oriented to person, place, and time.  Psychiatric:        Behavior: Behavior is cooperative.      UC Treatments / Results  Labs (all labs ordered are listed, but only abnormal results are displayed) Labs Reviewed - No data to display  EKG   Radiology No results found.  Procedures Procedures (including critical care time)  Medications Ordered in UC Medications  ketorolac (TORADOL) 30 MG/ML injection 30 mg (30 mg Intramuscular Given 08/11/23 1956)    Initial Impression / Assessment and Plan / UC Course  I have reviewed the triage vital signs and the nursing notes.  Pertinent labs & imaging results that were available during my care of the patient were reviewed by me and considered in my medical decision  making (see chart for details).   Patient is well-appearing, normotensive, afebrile, not tachycardic, not tachypneic, oxygenating well on room air.    1. Acute left-sided low back pain with left-sided sciatica No red flags in history or on exam today Symptoms are consistent with sciatic nerve pain We treated  with Toradol 30 mg IM in urgent care today, start daily diclofenac for 2 weeks and recommended close follow-up with orthopedic provider Sciatic rehab exercises in the meantime Strict ER precautions discussed  The patient was given the opportunity to ask questions.  All questions answered to their satisfaction.  The patient is in agreement to this plan.    Final Clinical Impressions(s) / UC Diagnoses   Final diagnoses:  Acute left-sided low back pain with left-sided sciatica     Discharge Instructions      The pain you are having in the left side is consistent with sciatica.  We have given you an injection of a nonsteroidal anti-inflammatory medicine called Toradol today.  You can start taking the diclofenac pills tomorrow twice daily with food to help with inflammation.  Continue muscle relaxant at nighttime as Tylenol as needed during the day.  Recommend close follow-up with sports medicine.   ED Prescriptions     Medication Sig Dispense Auth. Provider   diclofenac (VOLTAREN) 25 MG EC tablet Take 1 tablet (25 mg total) by mouth 2 (two) times daily for 14 days. Take with food 28 tablet Valentino Nose, NP      PDMP not reviewed this encounter.   Valentino Nose, NP 08/12/23 [phone]

## 2023-08-19 DIAGNOSIS — M5442 Lumbago with sciatica, left side: Secondary | ICD-10-CM | POA: Diagnosis not present

## 2023-09-04 DIAGNOSIS — M5442 Lumbago with sciatica, left side: Secondary | ICD-10-CM | POA: Diagnosis not present

## 2023-09-04 DIAGNOSIS — M545 Low back pain, unspecified: Secondary | ICD-10-CM | POA: Diagnosis not present

## 2023-09-08 DIAGNOSIS — M5442 Lumbago with sciatica, left side: Secondary | ICD-10-CM | POA: Diagnosis not present

## 2023-09-08 DIAGNOSIS — M545 Low back pain, unspecified: Secondary | ICD-10-CM | POA: Diagnosis not present

## 2023-09-11 DIAGNOSIS — M545 Low back pain, unspecified: Secondary | ICD-10-CM | POA: Diagnosis not present

## 2023-09-11 DIAGNOSIS — M5442 Lumbago with sciatica, left side: Secondary | ICD-10-CM | POA: Diagnosis not present

## 2023-09-15 DIAGNOSIS — M545 Low back pain, unspecified: Secondary | ICD-10-CM | POA: Diagnosis not present

## 2023-09-15 DIAGNOSIS — M542 Cervicalgia: Secondary | ICD-10-CM | POA: Diagnosis not present

## 2023-09-17 DIAGNOSIS — M5442 Lumbago with sciatica, left side: Secondary | ICD-10-CM | POA: Diagnosis not present

## 2023-09-17 DIAGNOSIS — M545 Low back pain, unspecified: Secondary | ICD-10-CM | POA: Diagnosis not present

## 2023-09-22 DIAGNOSIS — M545 Low back pain, unspecified: Secondary | ICD-10-CM | POA: Diagnosis not present

## 2023-09-22 DIAGNOSIS — M5442 Lumbago with sciatica, left side: Secondary | ICD-10-CM | POA: Diagnosis not present

## 2023-09-25 DIAGNOSIS — M5442 Lumbago with sciatica, left side: Secondary | ICD-10-CM | POA: Diagnosis not present

## 2023-09-25 DIAGNOSIS — M545 Low back pain, unspecified: Secondary | ICD-10-CM | POA: Diagnosis not present

## 2023-10-02 DIAGNOSIS — M5442 Lumbago with sciatica, left side: Secondary | ICD-10-CM | POA: Diagnosis not present

## 2023-10-04 ENCOUNTER — Encounter: Payer: Self-pay | Admitting: Emergency Medicine

## 2023-10-04 ENCOUNTER — Ambulatory Visit
Admission: EM | Admit: 2023-10-04 | Discharge: 2023-10-04 | Disposition: A | Discharge status: Home / Self Care | Attending: Physician Assistant | Admitting: Physician Assistant

## 2023-10-04 ENCOUNTER — Other Ambulatory Visit: Payer: Self-pay

## 2023-10-04 DIAGNOSIS — J0140 Acute pansinusitis, unspecified: Secondary | ICD-10-CM | POA: Diagnosis not present

## 2023-10-04 LAB — POC COVID19/FLU A&B COMBO
Covid Antigen, POC: NEGATIVE
Influenza A Antigen, POC: NEGATIVE
Influenza B Antigen, POC: NEGATIVE

## 2023-10-04 MED ORDER — AMOXICILLIN-POT CLAVULANATE 875-125 MG PO TABS: 1.0000 | ORAL_TABLET | Freq: Two times a day (BID) | ORAL | 0 refills | Status: AC

## 2023-10-04 NOTE — ED Triage Notes (Addendum)
 Pt reports nasal congestion x7 days. Reports chills, headache, runny nose, headache, decreased appetite since last night. Reports son has something similar.

## 2023-10-04 NOTE — Discharge Instructions (Signed)
 We are treating you for sinus infection.  Start Augmentin twice daily for 7 days.  Use over-the-counter medication including Mucinex, Flonase, Tylenol .  I also recommend nasal saline and sinus rinses as well as a humidifier in your room.  Make sure you drink plenty of fluid.  If you are not feeling better within 5 to 7 days please return for reevaluation.  If anything worsens and you have high fever, worsening cough, shortness of breath, chest pain, weakness, nausea, vomiting you need to be seen immediately.

## 2023-10-04 NOTE — ED Provider Notes (Signed)
 [facility]    CSN: [id number] Arrival date & time: 10/04/23  4742      History   Chief Complaint Chief Complaint  Patient presents with   Nasal Congestion    HPI ALYJAH LOVINGOOD is a 43 y.o. male.   Patient presents today with a weeklong history of URI symptoms that have worsened significantly in the past 36 hours.  He reports nasal congestion, sinus pressure, headache, mild cough.  Denies any fever, chest pain, shortness of breath, nausea, vomiting.  He has been taking multiple over-the-counter medications without improvement of symptoms.  He reports that his son was also sick with similar symptoms before he got sick.  He denies any history of allergies, asthma, COPD, smoking (quit many years ago).  Denies any recent antibiotics or steroids.  He is having difficulty with daily activities as a result of symptoms.    Past Medical History:  Diagnosis Date   Complication of anesthesia    SLOW TO WAKE   Gross hematuria    History of kidney stones    Left ureteral stone    Renal calculus, left    Urgency of urination     Patient Active Problem List   Diagnosis Date Noted   NEPHROLITHIASIS 09/05/2008   CHEST PAIN, ACUTE 08/29/2008   SMOKELESS TOBACCO ABUSE 03/23/2008   BACK PAIN, LUMBAR, CHRONIC 03/23/2008    Past Surgical History:  Procedure Laterality Date   CYSTO/  LEFT RETROGRADE PYELOGRAM/  LEFT URETERAL STENT PLACEMENT  01/14/2011   CYSTO/  URETHRAL DILATION/  RIGHT URETEROSCOPIC STONE EXTRACTION  02/16/2006   CYSTOSCOPY/URETEROSCOPY/HOLMIUM LASER/STENT PLACEMENT Left 05/21/2016   Procedure: CYSTOSCOPY/ RETROGRADE PYELOGRAM/ URETEROSCOPY/HOLMIUM LASER/STENT PLACEMENT;  Surgeon: Osborn Blaze, MD;  Location: [facility];  Service: Urology;  Laterality: Left;   LUMBAR FUSION  2006   L5 -- S1       Home Medications    Prior to Admission medications   Medication Sig Start Date End Date Taking? Authorizing Provider   amoxicillin-clavulanate (AUGMENTIN) 875-125 MG tablet Take 1 tablet by mouth every 12 (twelve) hours. 10/04/23  Yes Kristyana Notte K, PA-C  levothyroxine (SYNTHROID) 100 MCG tablet Take 100 mcg by mouth daily. 06/26/23   [provider]  losartan-hydrochlorothiazide (HYZAAR) 100-12.5 MG tablet Take 1 tablet by mouth daily. 06/28/23   [provider]    Family History History reviewed. No pertinent family history.  Social History Social History   Tobacco Use   Smoking status: Former    Current packs/day: 0.00    Average packs/day: 0.3 packs/day for 13.0 years (3.3 ttl pk-yrs)    Types: Cigarettes    Start date: 05/16/2001    Quit date: 05/16/2014    Years since quitting: 9.3   Smokeless tobacco: Former    Types: Snuff  Substance Use Topics   Alcohol use: No   Drug use: No     Allergies   Patient has no known allergies.   Review of Systems Review of Systems  Constitutional:  Positive for activity change and fatigue. Negative for appetite change and fever.  HENT:  Positive for congestion, postnasal drip and sinus pressure. Negative for sneezing and sore throat.   Respiratory:  Positive for cough. Negative for shortness of breath.   Cardiovascular:  Negative for chest pain.  Gastrointestinal:  Negative for abdominal pain, diarrhea, nausea and vomiting.  Neurological:  Positive for headaches. Negative for dizziness and light-headedness.     Physical Exam Triage Vital Signs ED Triage Vitals  Encounter Vitals Group     BP 10/04/23 0916 133/85     Systolic BP Percentile --      Diastolic BP Percentile --      Pulse Rate 10/04/23 0916 (!) 116     Resp 10/04/23 0916 20     Temp 10/04/23 0916 99.2 F (37.3 C)     Temp Source 10/04/23 0916 Oral     SpO2 10/04/23 0916 97 %     Weight --      Height --      Head Circumference --      Peak Flow --      Pain Score 10/04/23 0914 3     Pain Loc --      Pain Education --      Exclude from Growth Chart --    No  data found.  Updated Vital Signs BP 133/85 (BP Location: Right Arm)   Pulse (!) 103   Temp 99.2 F (37.3 C) (Oral)   Resp 20   SpO2 98%   Visual Acuity Right Eye Distance:   Left Eye Distance:   Bilateral Distance:    Right Eye Near:   Left Eye Near:    Bilateral Near:     Physical Exam Vitals reviewed.  Constitutional:      General: He is awake.     Appearance: Normal appearance. He is well-developed. He is not ill-appearing.     Comments: Very pleasant male appears stated age in no acute distress sitting comfortably in exam room  HENT:     Head: Normocephalic and atraumatic.     Right Ear: Tympanic membrane, ear canal and external ear normal. Tympanic membrane is not erythematous or bulging.     Left Ear: Tympanic membrane, ear canal and external ear normal. Tympanic membrane is not erythematous or bulging.     Nose: Congestion present.     Right Sinus: Maxillary sinus tenderness and frontal sinus tenderness present.     Left Sinus: Maxillary sinus tenderness and frontal sinus tenderness present.     Mouth/Throat:     Pharynx: Uvula midline. Posterior oropharyngeal erythema and postnasal drip present. No oropharyngeal exudate or uvula swelling.  Cardiovascular:     Rate and Rhythm: Normal rate and regular rhythm.     Heart sounds: Normal heart sounds, S1 normal and S2 normal. No murmur heard. Pulmonary:     Effort: Pulmonary effort is normal. No accessory muscle usage or respiratory distress.     Breath sounds: Normal breath sounds. No stridor. No wheezing, rhonchi or rales.     Comments: Clear to auscultation bilaterally Abdominal:     General: Bowel sounds are normal.     Palpations: Abdomen is soft.     Tenderness: There is no abdominal tenderness.  Neurological:     Mental Status: He is alert.  Psychiatric:        Behavior: Behavior is cooperative.      UC Treatments / Results  Labs (all labs ordered are listed, but only abnormal results are  displayed) Labs Reviewed  POC COVID19/FLU A&B COMBO    EKG   Radiology No results found.  Procedures Procedures (including critical care time)  Medications Ordered in UC Medications - No data to display  Initial Impression / Assessment and Plan / UC Course  I have reviewed the triage vital signs and the nursing notes.  Pertinent labs & imaging results that were available during my care of the patient were reviewed by me and  considered in my medical decision making (see chart for details).     Patient is well-appearing, afebrile, nontoxic.  He is mildly tachycardic but I believe this is related to over-the-counter medication use.  Viral testing was obtained during triage and was negative for COVID and flu.  Given his prolonged and recent double worsening of symptoms concern for secondary bacterial infection.  Will cover for sinusitis with Augmentin twice daily for 7 days.  He was encouraged to continue over-the-counter medication including Mucinex, Flonase, Tylenol .  Recommended that he rest and drink plenty of fluid.  If he is not feeling better within a week or if anything worsens and he has high fever, worsening cough, shortness of breath, chest pain, nausea, vomiting he needs to be seen immediately.  Strict return precautions given.  Excuse note provided.  Final Clinical Impressions(s) / UC Diagnoses   Final diagnoses:  Acute non-recurrent pansinusitis     Discharge Instructions      We are treating you for sinus infection.  Start Augmentin twice daily for 7 days.  Use over-the-counter medication including Mucinex, Flonase, Tylenol .  I also recommend nasal saline and sinus rinses as well as a humidifier in your room.  Make sure you drink plenty of fluid.  If you are not feeling better within 5 to 7 days please return for reevaluation.  If anything worsens and you have high fever, worsening cough, shortness of breath, chest pain, weakness, nausea, vomiting you need to be seen  immediately.   ED Prescriptions     Medication Sig Dispense Auth. Provider   amoxicillin-clavulanate (AUGMENTIN) 875-125 MG tablet Take 1 tablet by mouth every 12 (twelve) hours. 14 tablet Samiah Ricklefs K, PA-C      PDMP not reviewed this encounter.   Budd Cargo, PA-C 10/04/23 1001

## 2023-10-13 DIAGNOSIS — M545 Low back pain, unspecified: Secondary | ICD-10-CM | POA: Diagnosis not present

## 2023-10-19 DIAGNOSIS — Z0001 Encounter for general adult medical examination with abnormal findings: Secondary | ICD-10-CM | POA: Diagnosis not present

## 2023-10-19 DIAGNOSIS — E063 Autoimmune thyroiditis: Secondary | ICD-10-CM | POA: Diagnosis not present

## 2023-10-19 DIAGNOSIS — Z6828 Body mass index (BMI) 28.0-28.9, adult: Secondary | ICD-10-CM | POA: Diagnosis not present

## 2023-10-19 DIAGNOSIS — I10 Essential (primary) hypertension: Secondary | ICD-10-CM | POA: Diagnosis not present

## 2023-10-19 DIAGNOSIS — Z1331 Encounter for screening for depression: Secondary | ICD-10-CM | POA: Diagnosis not present

## 2023-10-19 DIAGNOSIS — H6120 Impacted cerumen, unspecified ear: Secondary | ICD-10-CM | POA: Diagnosis not present

## 2023-10-19 DIAGNOSIS — E663 Overweight: Secondary | ICD-10-CM | POA: Diagnosis not present

## 2023-10-30 DIAGNOSIS — M5416 Radiculopathy, lumbar region: Secondary | ICD-10-CM | POA: Diagnosis not present

## 2023-11-12 DIAGNOSIS — M5416 Radiculopathy, lumbar region: Secondary | ICD-10-CM | POA: Diagnosis not present

## 2023-12-14 DIAGNOSIS — E063 Autoimmune thyroiditis: Secondary | ICD-10-CM | POA: Diagnosis not present

## 2023-12-14 DIAGNOSIS — M5416 Radiculopathy, lumbar region: Secondary | ICD-10-CM | POA: Diagnosis not present

## 2024-02-19 DIAGNOSIS — M5416 Radiculopathy, lumbar region: Secondary | ICD-10-CM | POA: Diagnosis not present

## 2024-03-10 DIAGNOSIS — M5416 Radiculopathy, lumbar region: Secondary | ICD-10-CM | POA: Diagnosis not present

## 2024-03-26 IMAGING — CT CT RENAL STONE PROTOCOL
2 of 4 series · 16 of 46 positions shown, 18 images · non-contrast
Comparison: 07/19/2017

CLINICAL DATA: Right flank pain since [DATE] a.m.



[Series 2: axial st · axial · 0.92mm/px · z∈[+976,+1426]mm · 13 of 102 slices shown, 15 images]
[im 6/102  soft-tissue]
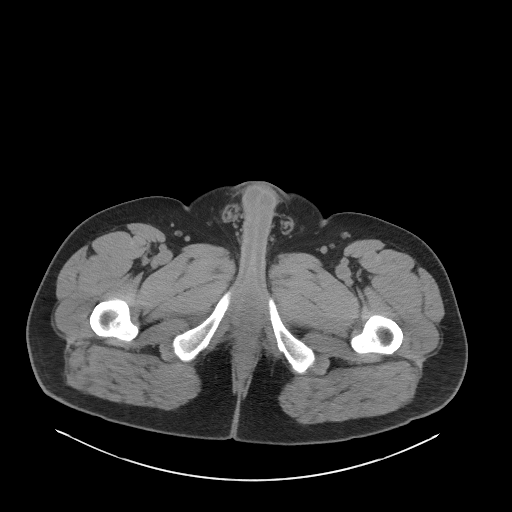
[im 6/102  bone]
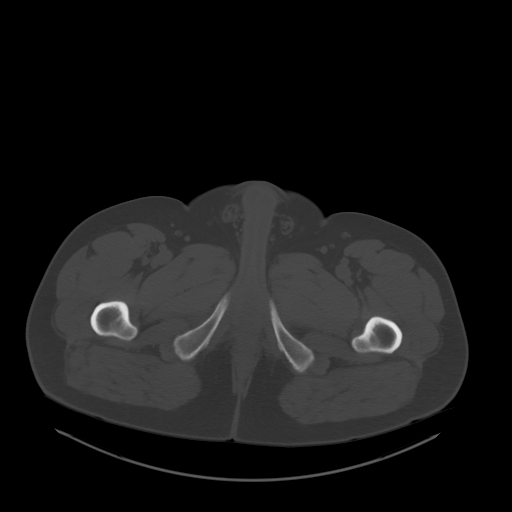
[im 12/102  soft-tissue]
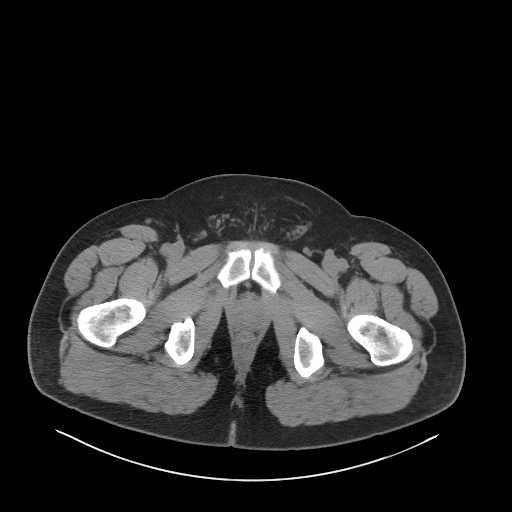
[im 24/102  soft-tissue]
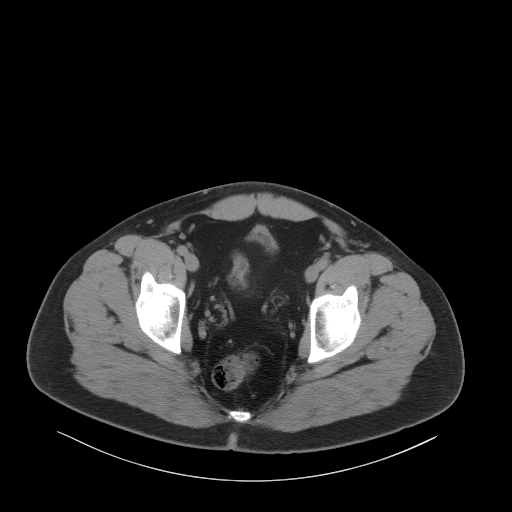
[im 30/102  soft-tissue]
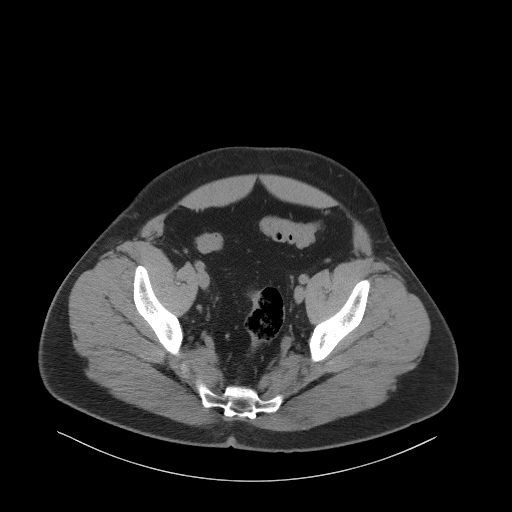
[im 36/102  soft-tissue]
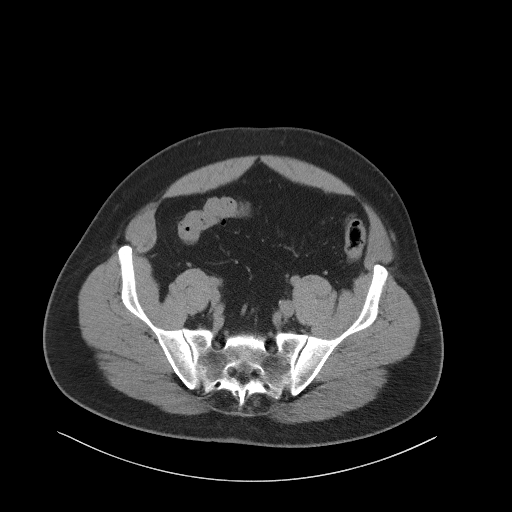
[im 42/102  soft-tissue]
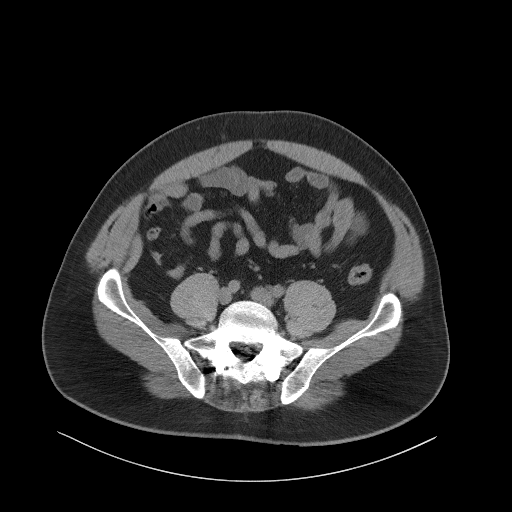
[im 54/102  soft-tissue]
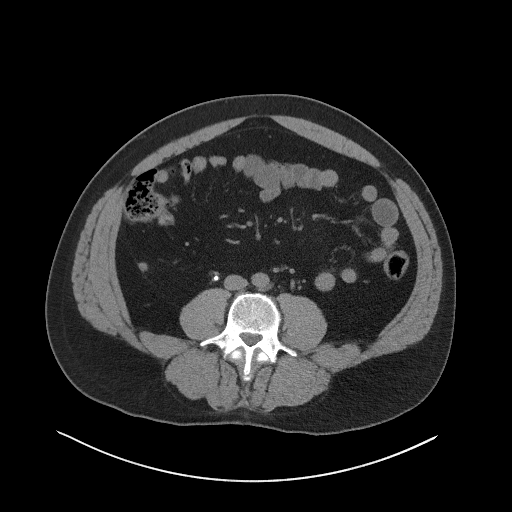
[im 60/102  soft-tissue]
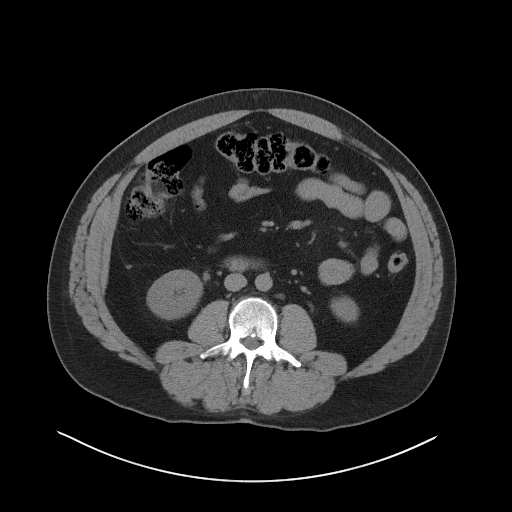
[im 66/102  soft-tissue]
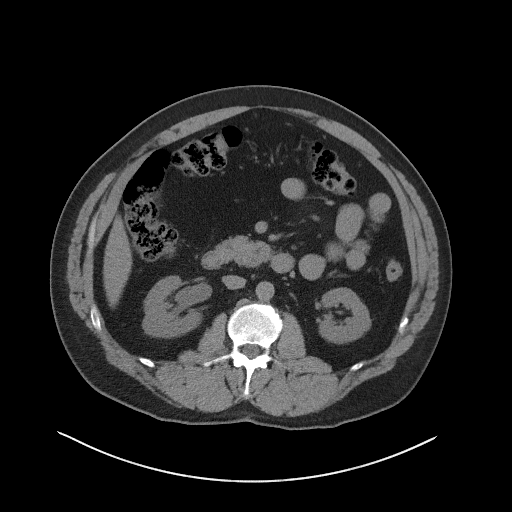
[im 66/102  bone]
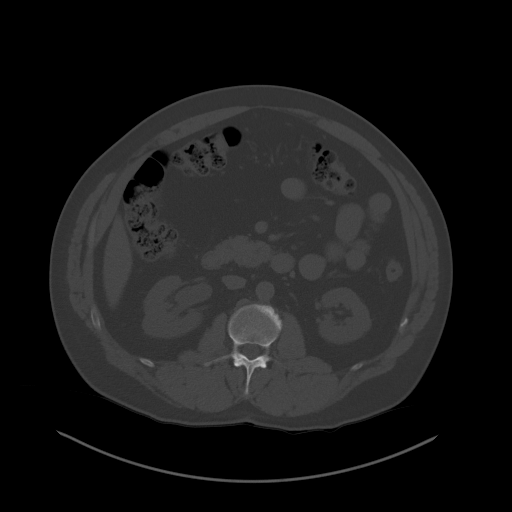
[im 72/102  soft-tissue]
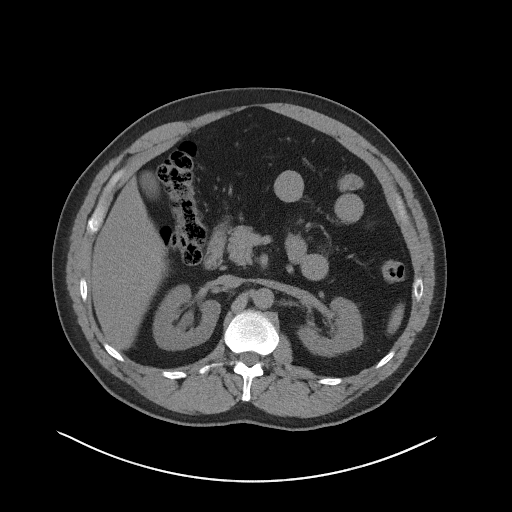
[im 78/102  soft-tissue]
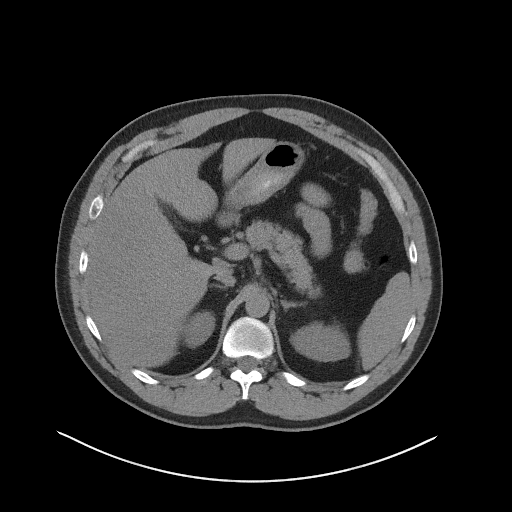
[im 90/102  soft-tissue]
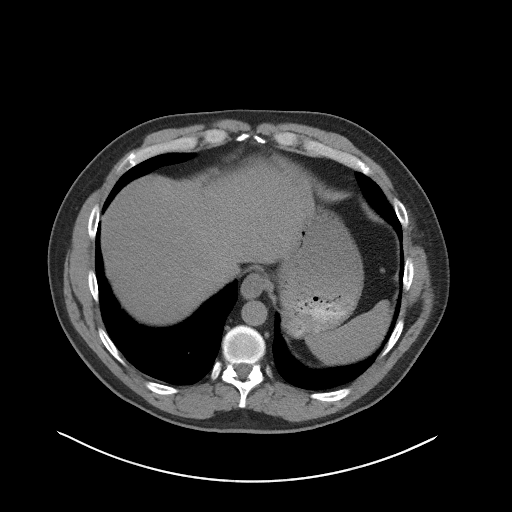
[im 96/102  soft-tissue]
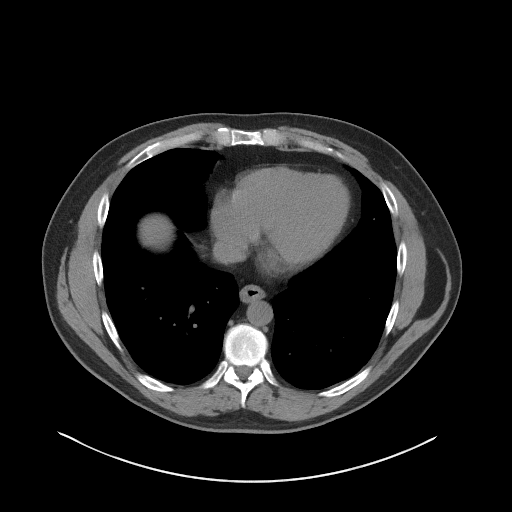

[Series 5: coronal st · coronal · 0.85mm/px · 3 of 101 slices shown]
[im 34/101  soft-tissue]
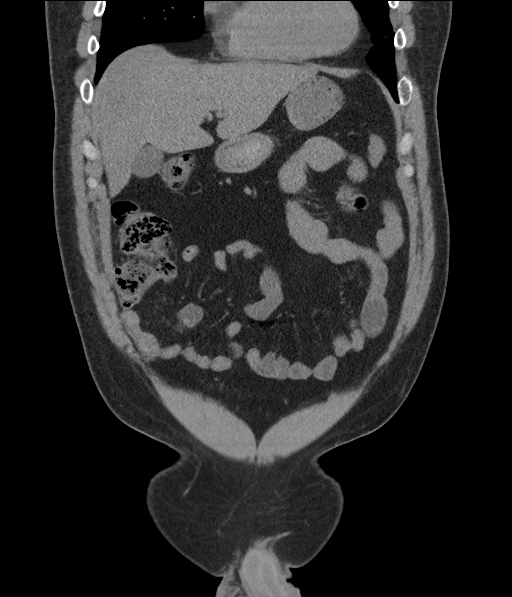
[im 45/101  soft-tissue]
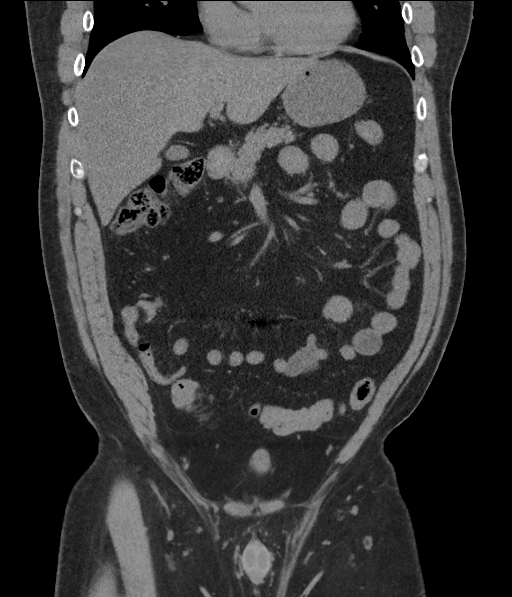
[im 56/101  soft-tissue]
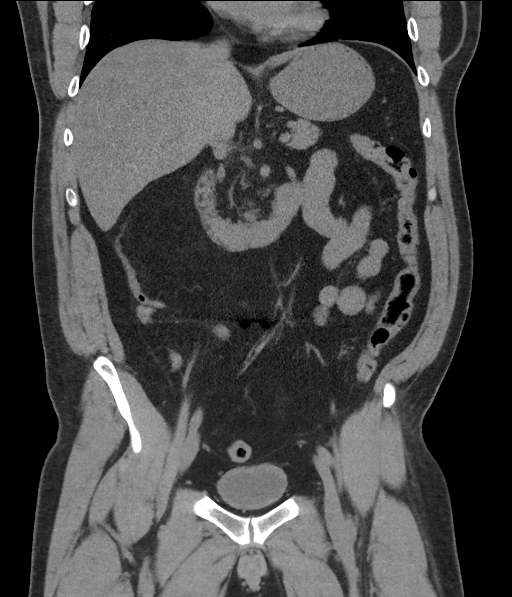

[16 of 46 positions shown; findings below may reference images not displayed]

FINDINGS: Lower chest: 4 mm calcified nodule in the right posterior
costophrenic sulcus likely result of prior granulomatous
inflammation. Lung bases otherwise clear.

Hepatobiliary: No focal liver abnormality is seen. No gallstones,
gallbladder wall thickening, or biliary dilatation.

Pancreas: Unremarkable. No pancreatic ductal dilatation or
surrounding inflammatory changes.

Spleen: Normal in size without focal abnormality.

Adrenals/Urinary Tract: Adrenal glands are normal.

3 nonobstructing right renal calculi are present with the largest
measuring 5 mm. There is minimal right hydronephrosis and
hydroureter due to 4 mm obstructing calculus in the proximal ureter.

7 mm nonobstructing calculus present in the lower pole of the left
kidney.

Stomach/Bowel: Stomach is within normal limits. Appendix appears
normal. No evidence of bowel wall thickening, distention, or
inflammatory changes.

Vascular/Lymphatic: No significant vascular findings are present. No
enlarged abdominal or pelvic lymph nodes.

Reproductive: Prostate is unremarkable.

Other: No abdominal wall hernia or abnormality. No abdominopelvic
ascites.

Musculoskeletal: 5.6 x 2.6 cm lipomatous lesion partially visualized
underlying the left latissimus dorsi muscle. No acute osseous
abnormality. Posterior fusion changes seen at L5-S1. The right S1
screw is fractured, unchanged from prior examination.
IMPRESSION: 1. Minimal right hydronephrosis due to 4 mm obstructing calculus in
the right proximal ureter.
2. Bilateral nonobstructing calculi.
